# Patient Record
Sex: Male | Born: 1955 | Race: White | Hispanic: No | Marital: Married | Smoking: Current every day smoker
Health system: Southern US, Community
[De-identification: ages and names within clinical notes are randomized; demographics above are authoritative.]

## PROBLEM LIST (undated history)

## (undated) DIAGNOSIS — M1990 Unspecified osteoarthritis, unspecified site: Secondary | ICD-10-CM

## (undated) DIAGNOSIS — D696 Thrombocytopenia, unspecified: Secondary | ICD-10-CM

## (undated) DIAGNOSIS — E039 Hypothyroidism, unspecified: Secondary | ICD-10-CM

## (undated) DIAGNOSIS — E291 Testicular hypofunction: Secondary | ICD-10-CM

## (undated) DIAGNOSIS — Z9989 Dependence on other enabling machines and devices: Secondary | ICD-10-CM

## (undated) DIAGNOSIS — Z8489 Family history of other specified conditions: Secondary | ICD-10-CM

## (undated) DIAGNOSIS — G4733 Obstructive sleep apnea (adult) (pediatric): Secondary | ICD-10-CM

## (undated) DIAGNOSIS — I10 Essential (primary) hypertension: Secondary | ICD-10-CM

## (undated) DIAGNOSIS — I739 Peripheral vascular disease, unspecified: Secondary | ICD-10-CM

## (undated) HISTORY — PX: KNEE ARTHROSCOPY: SUR90

## (undated) HISTORY — PX: FEMORAL-POPLITEAL BYPASS GRAFT: SHX937

## (undated) HISTORY — PX: LUMBAR DISC SURGERY: SHX700

## (undated) HISTORY — PX: NASAL SEPTUM SURGERY: SHX37

## (undated) HISTORY — PX: APPENDECTOMY: SHX54

## (undated) HISTORY — PX: UVULOPALATOPHARYNGOPLASTY: SHX827

---

## 2004-12-20 ENCOUNTER — Observation Stay (HOSPITAL_COMMUNITY): Admission: RE | Admit: 2004-12-20 | Discharge: 2004-12-21 | Payer: Self-pay | Admitting: Orthopedic Surgery

## 2006-03-11 IMAGING — CR DG SPINE 1V PORT
1 series · 1 of 1 positions shown · non-contrast
Comparison: none

HISTORY: Disc herniation L4-L5, surgery

PORTABLE LUMBAR SPINE ONE VIEW:
Portable exam 5599 hours.
Metallic probes extend from posterior approach to the spinous processes of L4
and L5.
Labeling presumes presence of 5 lumbar vertebra. 
Disc space narrowing L4-L5.

[view not recorded]
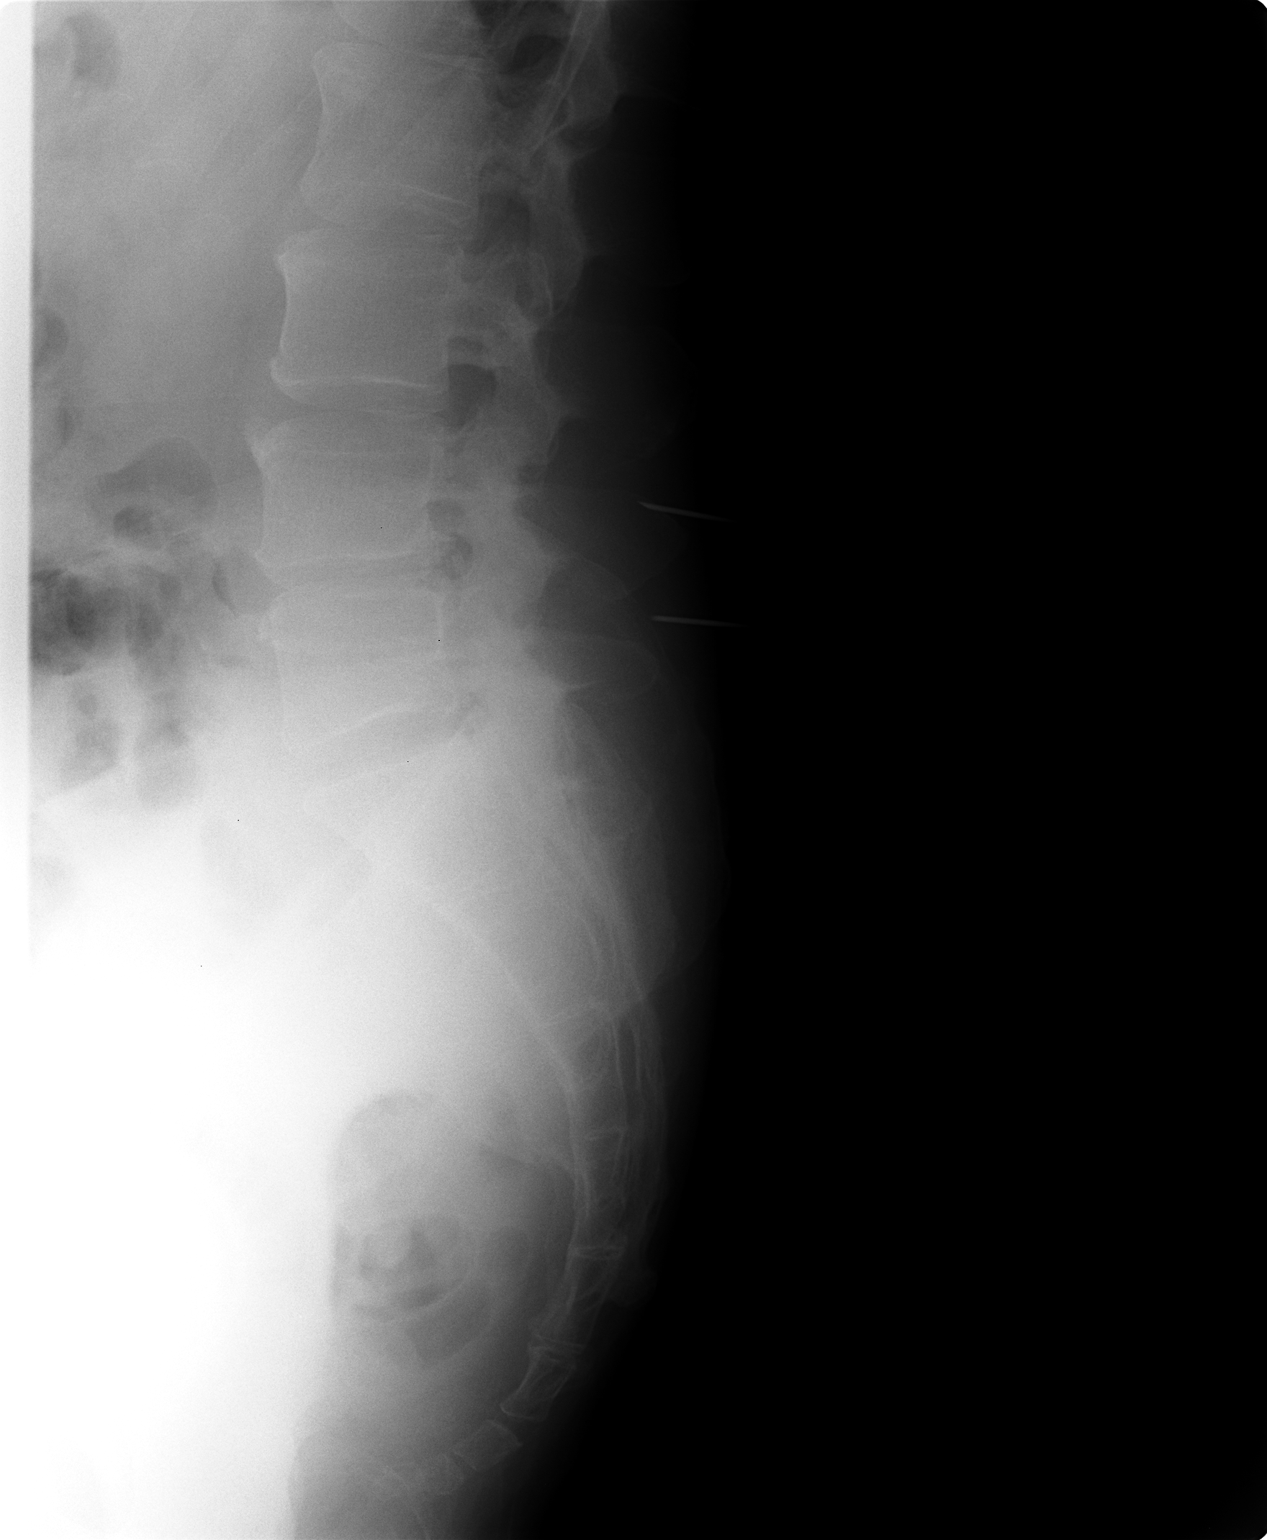

[1 of 1 positions shown; findings below may reference images not displayed]

IMPRESSION: Posterior localization of L4-L5.

PORTABLE LUMBAR SPINE ONE VIEW:

Exam #[DATE] hours.
Posterior metallic surgical instruments located at L4-L5 interspace.
IMPRESSION: Posterior localization of L4-L5.

## 2007-07-22 ENCOUNTER — Ambulatory Visit: Payer: Self-pay | Admitting: Vascular Surgery

## 2007-07-25 ENCOUNTER — Ambulatory Visit (HOSPITAL_COMMUNITY): Admission: RE | Admit: 2007-07-25 | Discharge: 2007-07-25 | Payer: Self-pay | Admitting: Vascular Surgery

## 2007-07-25 ENCOUNTER — Ambulatory Visit: Payer: Self-pay | Admitting: Vascular Surgery

## 2007-08-01 ENCOUNTER — Encounter: Payer: Self-pay | Admitting: Vascular Surgery

## 2007-08-01 ENCOUNTER — Inpatient Hospital Stay (HOSPITAL_COMMUNITY): Admission: RE | Admit: 2007-08-01 | Discharge: 2007-08-03 | Payer: Self-pay | Admitting: Vascular Surgery

## 2007-08-19 ENCOUNTER — Ambulatory Visit: Payer: Self-pay | Admitting: Vascular Surgery

## 2008-10-13 IMAGING — CR DG CHEST 2V
2 series · 2 of 2 positions shown · non-contrast
Comparison: None

CLINICAL DATA: Peripheral vascular disease, preop

CHEST - 2 VIEW:

[view not recorded (1 of 2)]
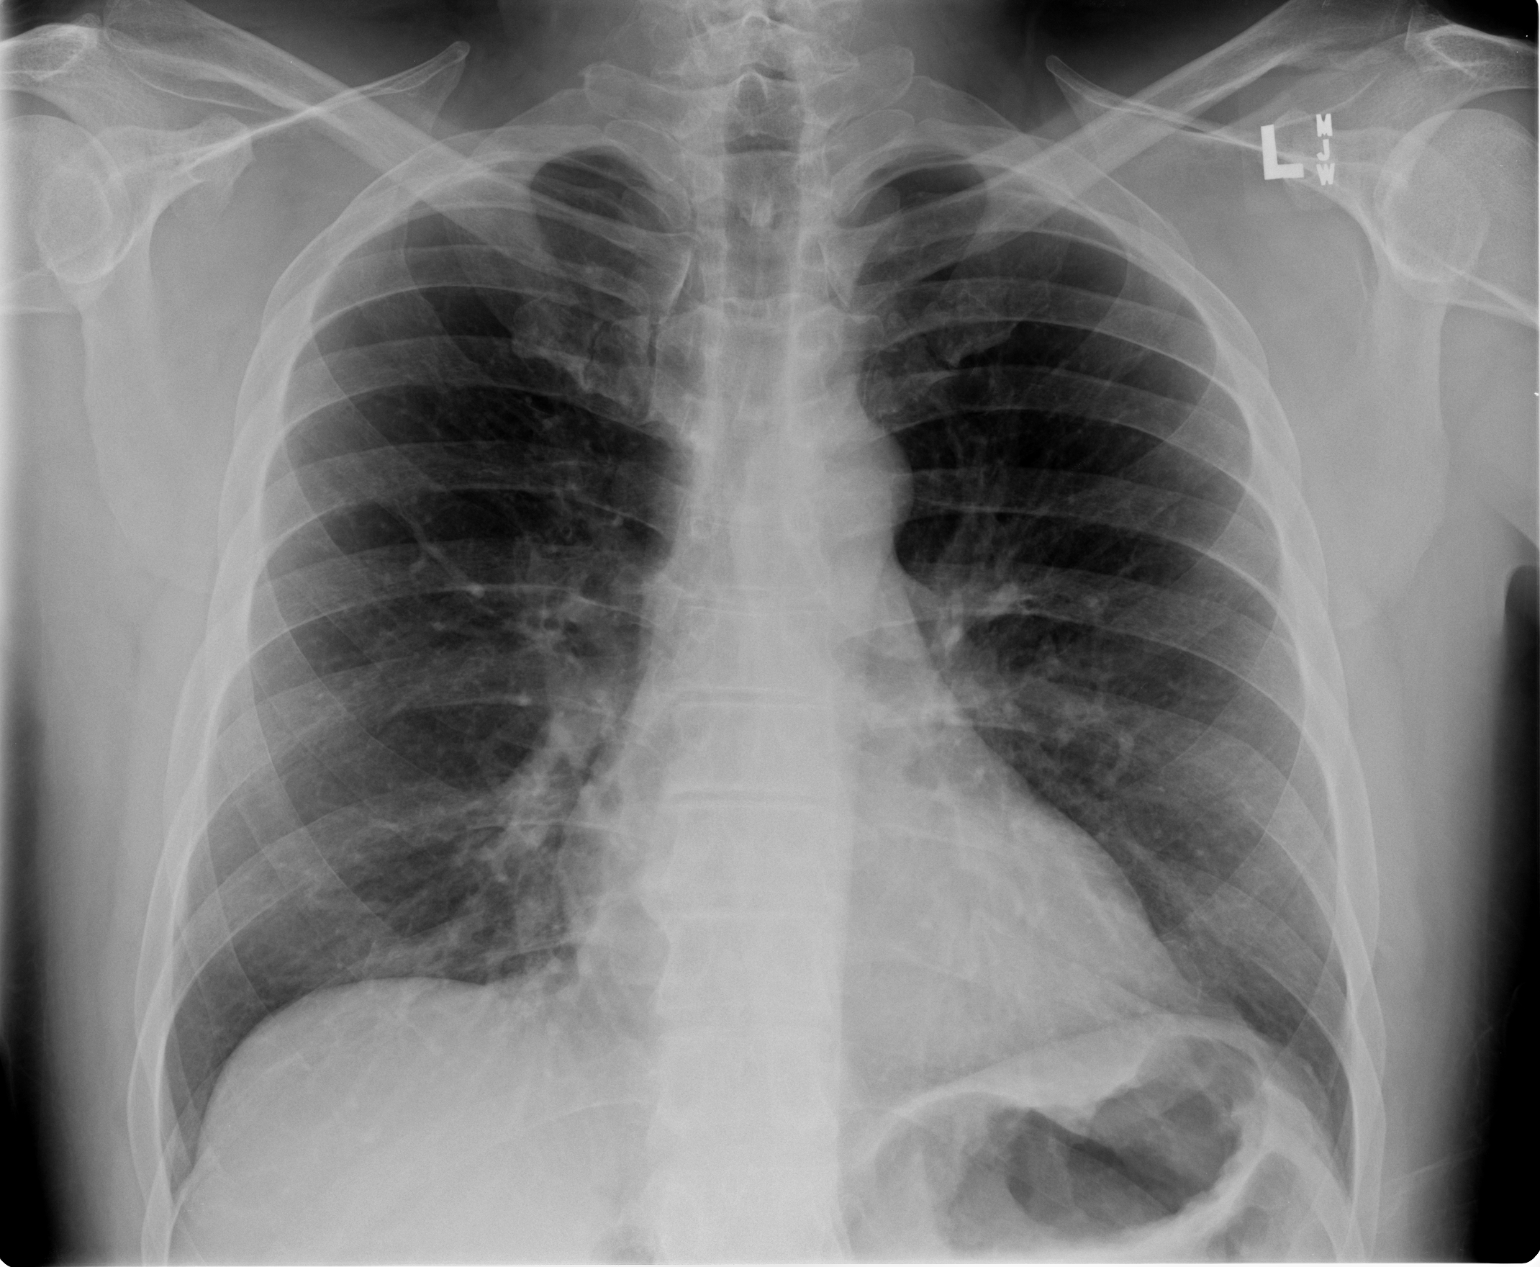

[view not recorded (2 of 2)]
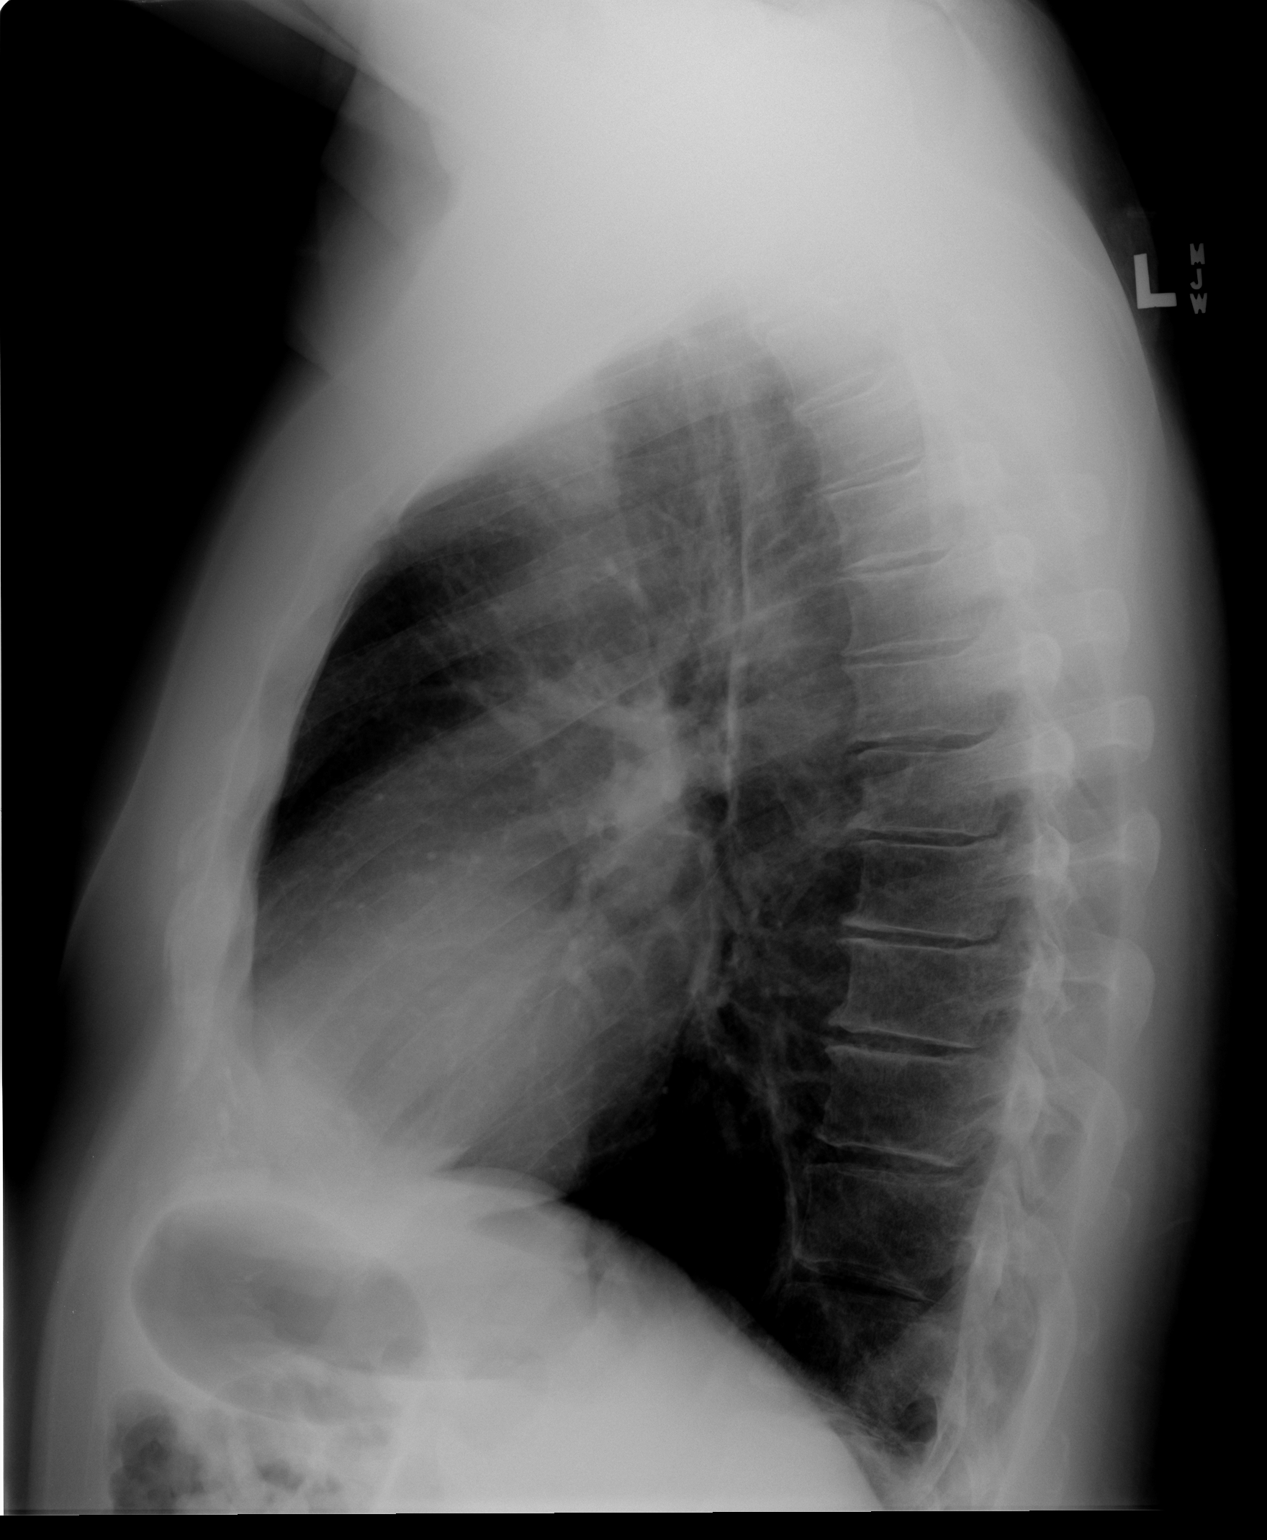

[2 of 2 positions shown; findings below may reference images not displayed]

FINDINGS: Heart and mediastinal contours are within normal limits. There is
mild peribronchial thickening and hyperinflation. No focal opacities or
effusions. Degenerative changes in the thoracic spine.
IMPRESSION: Bronchitic changes, likely chronic.

## 2008-10-20 IMAGING — CR DG ANG/EXT/UNI/OR LEFT
1 series · 1 of 1 positions shown · non-contrast
Comparison: none

CLINICAL DATA: Left femoral artery occlusive disease.
 LEFT LOWER EXTREMITY INTRAOPERATIVE ANGIOGRAM - 08/01/07 AT 5191 HOURS:

[view not recorded]
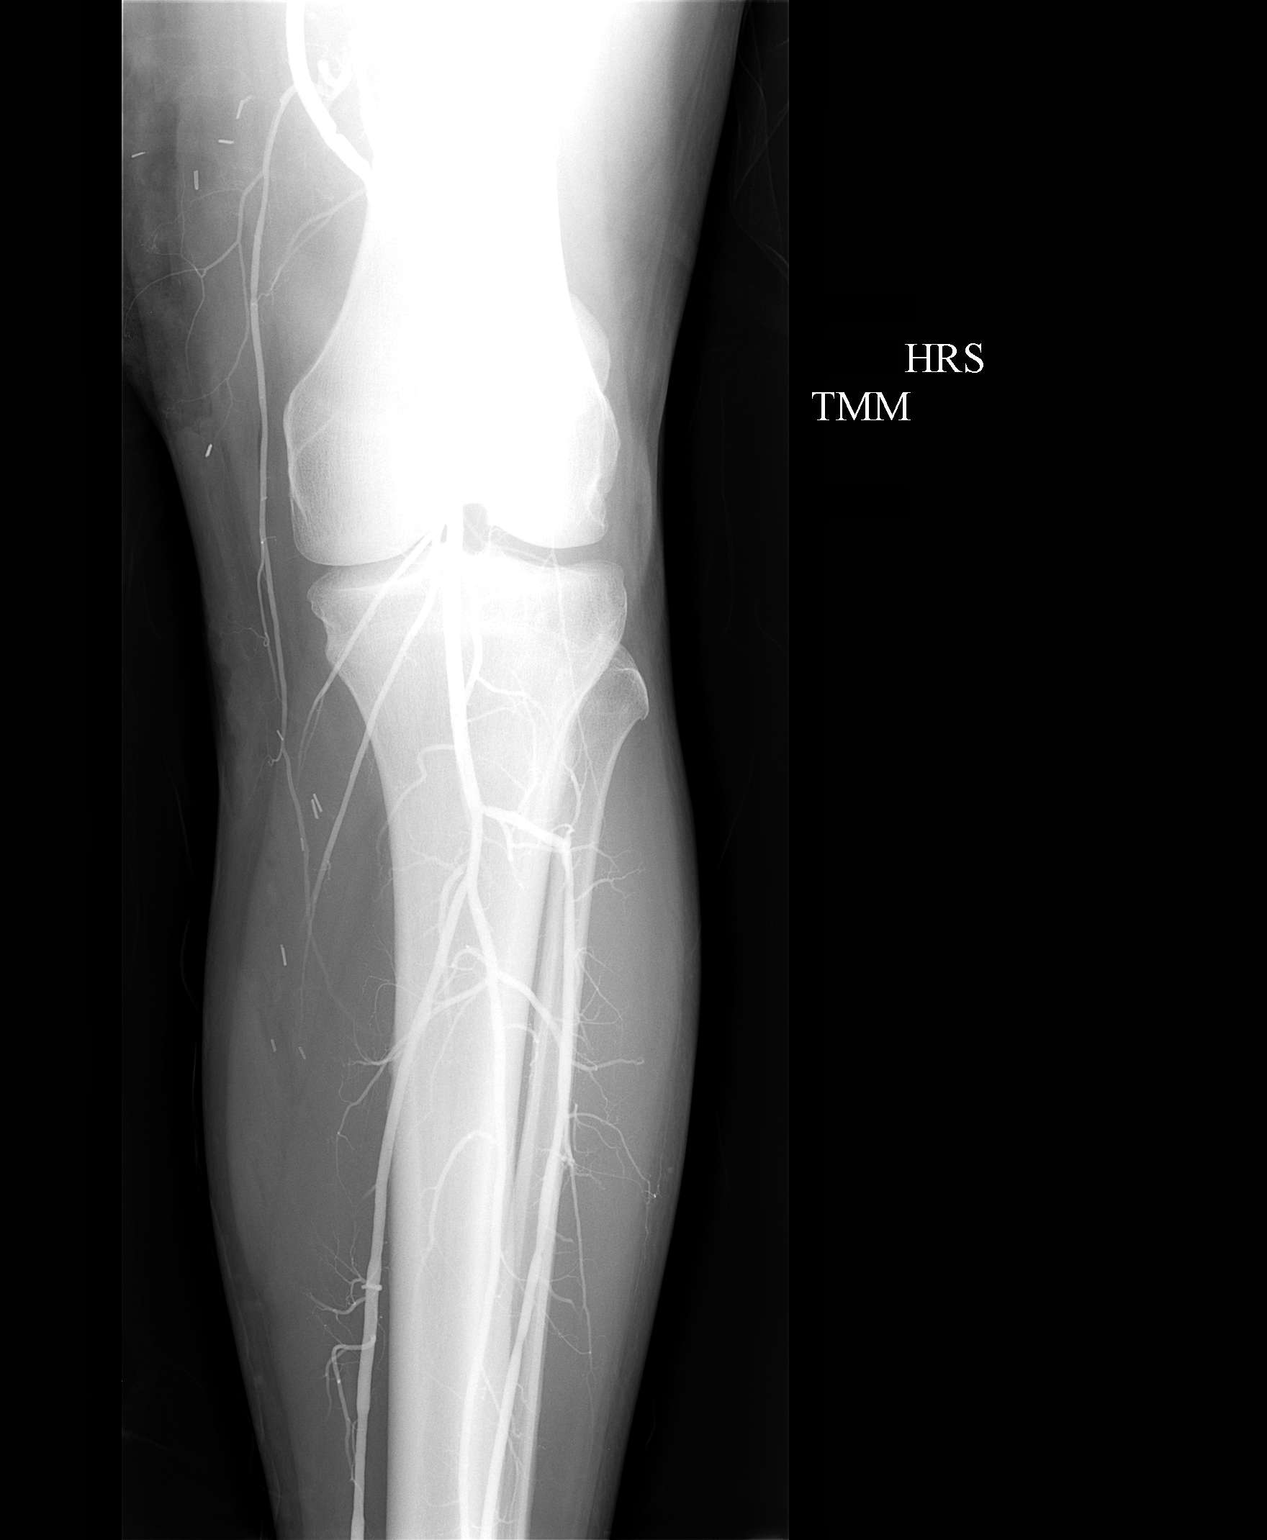

[1 of 1 positions shown; findings below may reference images not displayed]

FINDINGS: A femoral to popliteal artery bypass graft above the knee is patent.  The distal anastomosis is patent.  The popliteal artery and proximal trifurcation vessels are patent.
IMPRESSION: Femoral popliteal bypass graft in the lower thigh and proximal leg is patent.

## 2011-04-10 NOTE — Op Note (Signed)
[name], [name]               ACCOUNT NO.:  [id number]   MEDICAL RECORD NO.:  [id number]          PATIENT TYPE:  INP   LOCATION:  2550                         FACILITY:  [facility]   PHYSICIAN:  Quita Skye. Hart Rochester, M.D.  DATE OF BIRTH:  1956/02/19   DATE OF PROCEDURE:  08/01/2007  DATE OF DISCHARGE:                               OPERATIVE REPORT   PREOPERATIVE DIAGNOSIS:  Left superficial femoral occlusion with  limiting claudication, left leg.   POSTOPERATIVE DIAGNOSIS:  Left superficial femoral occlusion with  limiting claudication, left leg.   OPERATIONS:  Left common femoral to popliteal (above-knee) bypass using  a nonreversed translocated saphenous vein graft (composite), left leg  with intraoperative arteriogram.   SURGEON:  Quita Skye. Hart Rochester, M.D.   FIRST ASSISTANT:  Janetta Hora. Fields, M.D.   SECOND ASSISTANT:  [name], P.A.   ANESTHESIA:  General endotracheal.   PROCEDURE:  The patient was taken to the operating room and placed in  the supine position at which time satisfactory general endotracheal  anesthesia was administered.  The left leg was prepped with Betadine  scrub and solution and draped in the routine sterile manner.   The saphenous vein was exposed at the saphenofemoral junction and  dissected free through multiple incisions along the medial aspect the  left leg.  It was a very good vein for about 10-15 cm where it tapered  down to a smaller size.  It then became larger again in the distal thigh  and then tapered down again below the knee.  It was removed to the  midcalf.  It was then gently dilated with heparinized saline and marked  for orientation purposes.  The common superficial and profunda femoris  arteries were dissected free in the groin incision and encircled with  vessel loops.  There was an excellent pulse in the common femoral  artery.  The superficial femoral artery was diffusely diseased but had a  pulse.  The popliteal artery was  exposed in the above-knee position just  distal to the adductor canal where it was a very soft vessel with a mild  posterior plaque.  The vein was then examined, and it was decided to  resect about a 5-cm segment in the midportion.  This was done after  spatulating both ends.  A new end-to-end anastomosis was done with two 7-  0 Prolene sutures from the heel and the toe.  The patient was then  heparinized.  The common femoral artery was occluded proximally and  distally, opened with a 15 blade and extended with Potts scissors.  The  proximal end of the vein was spatulated and anastomosed end-to-side with  6-0 Prolene.  The clamps were then released.  There was good pulse down  to the first set of competent valves.  Using a retrograde valvulotome,  the valves were rendered incompetent, with resultant excellent flow out  of the distal end of the vein graft.  The vein was carefully delivered  through a subfascial anatomic tunnel.  The popliteal artery was occluded  proximally and distally, opened with a 15 blade extended  with Potts  scissors.  It would easily accept a 3.5-mm dilator distally, although it  did have a posterior plaque.  The vein was spatulated and anastomosed  end-to-side with 6-0 Prolene.  The clamps were released, and there was  an excellent pulse in the vein graft.  Intraoperative arteriogram  revealed a widely patent distal anastomosis with three-vessel runoff  below the knee.  Protamine was given to reverse the heparin.  Following  adequate hemostasis, the wound was irrigated with saline and closed in  layers with Vicryl in subcuticular fashion with Steri-Strips.  A sterile  dressing was applied.   The patient was taken to the recovery in satisfactory condition.      Quita Skye Hart Rochester, M.D.  Electronically Signed     JDL/MEDQ  D:  08/01/2007  T:  08/01/2007  Job:  [id number]

## 2011-04-10 NOTE — Assessment & Plan Note (Signed)
OFFICE VISIT   KHY, PITRE  DOB:  [birth year]                                       07/22/2007  ZOXWR#:[id number]   Mr. Michalowski returns today having worsening of his claudication symptoms  in the left calf and a lot of numbness involving the left foot when he  ambulates. This occurs after walking for about 3 to 4 minutes. He is  able to continue to walk but he has significant discomfort in the left  calf and numbness in the foot. His right leg has very minimal symptoms.  He has no rest pain or non healing ulcers by history.   PHYSICAL EXAMINATION:  Blood pressure is 141/90, heart rate 68,  respirations are 16. His femoral pulses are 2 to 3 + bilaterally. Right  leg has a 2 + popliteal and 2+ dorsalis pedis pulse. Left leg has  diminished pulses distally. Both feet are warm and well perfused.  Previous ABI in the left leg was 0.85 and on the right 1.1 which was  checked in January of 2007. I did not repeat ABIs today but he does have  evidence of femoral popliteal occlusive disease. He wants to proceed  with an angiogram. We will do that on this Friday August 29 and we will  then determine what options are available.   Quita Skye Hart Rochester, M.D.  Electronically Signed   JDL/MEDQ  D:  07/22/2007  T:  07/23/2007  Job:  [id number]   cc:   Maryruth Eve. Darrelyn Hillock, M.D.

## 2011-04-10 NOTE — Assessment & Plan Note (Signed)
OFFICE VISIT   GERY, SABEDRA  DOB:  02-Nov-1956                                       08/19/2007  WGNFA#:[id number]   The patient is status post a left femoral popliteal bypass graft done  September 5 using a composite saphenous vein graft from the left leg.  His vein was not an excellent vein and did have a portion in the mid  part that needed resection and primary reanastomosis.  His ABI today is  only 0.70 compared to 0.87 previously.  He states that his left leg  surgery are definitely improved from preoperatively, and that he is able  to walk longer distances before his calf becomes uncomfortable.  His  incisions have healed nicely and he has no distal edema.  He has an  excellent femoral pulse, but I am unable to palpate a popliteal pulse.  I had a scan of the graft performed today, and it appears that the  bypass is occluded.  This was a technically difficult scan and it took a  while to determine this.  I have told him that the graft is occluded,  and that if his symptoms worsen, we could consider an above the knee  popliteal with GORE-TEX graft, since his vein was fairly marginal, and  we will consider this in 3 months if his symptoms worsen.  Return to see  me in 3 months for further followup.   Quita Skye Hart Rochester, M.D.  Electronically Signed   JDL/MEDQ  D:  08/19/2007  T:  08/20/2007  Job:  [id number]

## 2011-04-10 NOTE — H&P (Signed)
HISTORY AND PHYSICAL EXAMINATION   July 29, 2007   Re:  JAMA, KRICHBAUM               DOB:  1956/06/03   The date of admission will be August 01, 2007.   CHIEF COMPLAINT:  Severe pain in left calf with ambulation.   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  He is currently  being limited to walking about 3 to 4 minutes, at which time he develops  severe symptoms, which will require him eventually to rest for relief.  He has no rest pain or history of non-healing ulcers, infection, or  ulceration.  He was found on angiography by Dr. Hart Rochester to have a left  superficial femoral occlusion with an above knee popliteal artery on the  left and is scheduled now for left femoral popliteal bypass grafting  using saphenous vein on September 5th.   PAST MEDICAL HISTORY:  Negative for diabetes, hypertension,  hyperlipidemia, coronary artery disease, arrhythmia, stroke, or deep  vein thrombosis.   PREVIOUS SURGERY:  1. Meniscectomy left knee.  2. Arthroscopy left knee.  3. Appendectomy.  4. Lumbar laminectomy.   ALLERGIES:  None known.   FAMILY HISTORY:  Positive for coronary artery disease in his father.  Negative for diabetes and stroke.   SOCIAL HISTORY:  Works in Patent examiner.  He does not admit to any  tobacco use at this time.  Drinks occasional alcohol.   REVIEW OF SYSTEMS:  HEART:  Denies any chest pain, dyspnea on exertion,  paroxysmal nocturnal dyspnea, orthopnea.  GENERAL:  Denies weight loss, anorexia  LUNGS:  Denies pulmonary symptoms such as chronic bronchitis, wheezing,  asthma.  GASTROINTESTINAL:  Negative for gastrointestinal symptoms.  MUSCULOSKELETAL:  Has occasional joint pain related to his orthopedic  problems.   ALLERGIES:  None known.   MEDICATIONS:  None.   PHYSICAL EXAMINATION:   Vital signs:  Blood pressure is 141/90, heart  rate 68, respirations 16.  General:  He is a healthy-appearing middle-  aged male in no apparent distress.  He is alert and oriented x3.  Neck:  Supple with 3+ carotid pulses palpable.  No bruits are audible.  Neurologic exam:  Normal.  No palpable adenopathy in the neck.  Chest:  Clear to auscultation.  Cardiovascular exam:  Regular rhythm with no  murmurs.  Upper extremities:  Pulses are 3+ bilaterally.  Abdomen:  Soft, nontender, with no palpable masses.  Lower extremities:  He has 3+  femoral, 2+ popliteal,, and 2+ dorsalis pedis pulses on the right.  Left  leg has a 3+ femoral with absent distal pulses.  Both feet are well-  perfused.   IMPRESSION:  1. Left leg claudication secondary to left superficial femoral      occlusion.  2. History of left knee surgery.  3. History of appendectomy and lumbar laminectomy.   PLAN:  To admit the patient  on Friday, September 5, for an elective  left femoral popliteal bypass graft with saphenous vein if it is usable.  Risks and benefits have been fully discussed with Mr. Weinreb and he  would like to proceed.   Quita Skye Hart Rochester, M.D.  Electronically Signed   JDL/MEDQ  D:  07/29/2007  T:  07/30/2007  Job:  [id number]   cc:   Windy Fast A. Darrelyn Hillock, M.D.

## 2011-04-10 NOTE — Op Note (Signed)
[name], [name]               ACCOUNT NO.:  [id number]   MEDICAL RECORD NO.:  [id number]          PATIENT TYPE:  AMB   LOCATION:  SDS                          FACILITY:  [facility]   PHYSICIAN:  Quita Skye. Hart Rochester, M.D.  DATE OF BIRTH:  Mar 12, 1956   DATE OF PROCEDURE:  07/25/2007  DATE OF DISCHARGE:                               OPERATIVE REPORT   PREOPERATIVE DIAGNOSIS:  Left leg claudication secondary to left  superficial femoral occlusive disease.   POSTOPERATIVE DIAGNOSIS:  Left leg claudication secondary to left  superficial femoral occlusive disease.   PROCEDURE:  1. Abdominal aortogram with bilateral lower extremity runoff via right      common femoral approach.  2. Selective catheterization of left common iliac artery with left      lower extremity angiogram.   SURGEON:  Quita Skye. Hart Rochester, M.D.   ANESTHESIA:  Local Xylocaine and Versed 2 mg intravenously.   CONTRAST:  145 mL.   COMPLICATIONS:  None.   DESCRIPTION OF PROCEDURE:  The patient was taken to the [facility]  Peripheral Endovascular Lab, placed in the supine position, at which  time both groins were prepped with Betadine solution and draped in  routine sterile manner.  After infiltration of 1% Xylocaine, the right  common femoral artery was entered percutaneously, a guidewire passed  into the suprarenal aorta under fluoroscopic guidance.  A 5 French  sheath and dilator were passed over the guidewire, the dilator removed,  and standard pigtail catheter positioned in the suprarenal aorta.  Flush  abdominal aortogram was performed injecting 20 mL of contrast at 20 mL  per second.  This revealed the aorta to be widely patent with no  evidence of any significant atherosclerosis.  Both renal arteries were  widely patent, being single on both sides.  The inferior mesenteric  artery was patent.  The common, internal and external iliac arteries  were patent.  The catheter was withdrawn into the terminal aorta and  iliac  angiography was performed revealing widely patent iliac systems  down to the common femorals bilaterally.   Because of the size of his legs, both legs were examined separately.  The left common iliac artery was selectively catheterized using an IMA  catheter and left lower extremity angiogram performed using the bolus  and chase technique.  This was done injecting 35 mL of contrast and it  revealed that the left common and profunda femoris arteries were widely  patent.  The left superficial femoral artery was diseased throughout,  being small in caliber proximally, totally occluded in the mid thigh,  reconstituting about 8 cm above the knee joint and being widely patent.  There was three vessel runoff on the left.  Additional views were  obtained using the peep hole technique, both of the above knee popliteal  artery and the runoff.  Following this, the IMA catheter was removed  over the guidewire. Right lower extremity angiogram was performed  through the sheath in a similar fashion.  The right common, superficial,  and profunda femoris arteries were all widely patent with no evidence of  any  significant stenosis throughout and there was three vessel runoff on  the right.  Having tolerated the procedure well, the sheath was removed,  adequate compression applied.  No complications ensued.   FINDINGS:  1. Widely patent aortoiliac system.  2. Widely patent right superficial femoral, popliteal, and tibial      vessels.  3. Total occlusion left superficial femoral artery with reconstitution      above knee popliteal artery and three vessel runoff.      Quita Skye Hart Rochester, M.D.  Electronically Signed     JDL/MEDQ  D:  07/25/2007  T:  07/26/2007  Job:  [id number]

## 2011-04-10 NOTE — Procedures (Signed)
BYPASS GRAFT EVALUATION   INDICATION:  Follow-up evaluation of lower extremity bypass graft.   HISTORY:  Diabetes:  No.  Cardiac:  No.  Hypertension:  No.  Smoking:  Yes.  Previous Surgery:  Left femoral-popliteal bypass graft on 08/04/2007,  with composite saphenous vein by Dr. Hart Rochester.   SINGLE LEVEL ARTERIAL EXAM                               RIGHT              LEFT  Brachial:                    126.               118.  Anterior tibial:             130.               72.  Posterior tibial:            136.               88.  Peroneal:  Ankle/brachial index:        > 1.0.             0.70.   PREVIOUS ABI:  Date: 08/01/2007.  RIGHT:  > 1.0.  LEFT:  0.87 (preop).   LOWER EXTREMITY BYPASS GRAFT DUPLEX EXAM:   DUPLEX:  The left superficial femoral and profunda artery were clearly  identified.  There appeared to be a significant number of collateral  vessels originating off of the superficial femoral artery.  In the  distal thigh region, the vein graft was identified and appeared to be  occluded.   IMPRESSION:  Right ABI stable from previous study.   The left ABI is lower than the preoperative level.   Occluded left femoral-popliteal bypass graft.   ___________________________________________  Quita Skye. Hart Rochester, M.D.   MC/MEDQ  D:  08/19/2007  T:  08/20/2007  Job:  [id number]
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
## 2011-04-13 NOTE — Op Note (Signed)
[name], [name]               ACCOUNT NO.:  [id number]   MEDICAL RECORD NO.:  [id number]          PATIENT TYPE:  AMB   LOCATION:  DAY                          FACILITY:  [facility]   PHYSICIAN:  Georges Lynch. Gioffre, M.D.DATE OF BIRTH:  1956-08-02   DATE OF PROCEDURE:  12/20/2004  DATE OF DISCHARGE:                                 OPERATIVE REPORT   PREOPERATIVE DIAGNOSES:  1.  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  2.  Central and to the left herniated disk, L4-5 on the left with a partial      foot drop on the left.   POSTOPERATIVE DIAGNOSIS:  1.  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  2.  Central and to the left herniated disk, L4-5 on the left with a partial      foot drop on the left.   OPERATION PERFORMED:  1.  Decompression of the lateral recess at L4-5 on the left.  2.  Microdiskectomy at L4-5 on the left with hemilaminectomy.   SURGEON:  Georges Lynch. [name], M.D.   ASSISTANT:  Ronnell Guadalajara, M.D.   ANESTHESIA:  General.   DESCRIPTION OF PROCEDURE:  Under general anesthesia, routine orthopedic  prepping and draping of the lower back was carried out.  The patient had 1 g  of IV Ancef preop.  At this time two needles were placed on the back for  localization purposes.  X-ray was taken.  We identified the L4-5 space.  An  incision then was made over the L4-5 interspace.  Bleeders were identified  and cauterized.  The muscle then was stripped from the lamina and spinous  process down to the L4-5 interspace.  Another x-ray was taken to verify our  position.  At this time I then inserted McCullough self-retaining  retractors.  I utilized the bur to bur down the lamina of 4 and 5 and the  lateral recess was decompressed as well.  Following this, we then utilized  our Kerrison rongeurs and carried out a hemilaminectomy.  We then removed  the ligamentum flavum with great care taken to protect the dura.  We  thoroughly irrigated  out the area.  We then went down and identified the  disk herniation.  We first cauterized the lateral recess veins. He had a  multitude of lateral recess veins that we cauterized with a bipolar.  We  first identified the nerve root and gently retracted it out of the way.  We  went down and did a foraminotomy.  We then identified the disk space and  made a cruciate incision, went down in the disk space and carried out a  diskectomy at 4-5 on the left.  We went all the way across the midline,  utilizing the Epstein curets as well as the nerve hook and teased out  several fragments that were subligamentous.  He also had a large hump of  bone in that area that was from his underlying bone spurs.  We freed up the  nerve root quite nicely, completed the  diskectomy, thoroughly irrigated out  the area and we explored the area once again to make sure there were no  other disk fragments.  We were able to easily get a hockey stick now down  around into the foramen without a problem.  We then took another x-ray with  an instrument in the space to verify  the position.  Following this, we then went on and irrigated the area again  and loosely applied some Thrombin soaked Gelfoam.  We closed the wound in  layers in the usual fashion.  The deep part of the wound proximally was left  open for drainage purposes.  The skin was closed with metal staples.  Sterile Neosporin dressing was applied.      RAG/MEDQ  D:  12/20/2004  T:  12/20/2004  Job:  [id number]

## 2011-09-07 LAB — CBC
HCT: 41.7
MCHC: 34.4
MCV: 91
MCV: 91.4
MCV: 90.8
Platelets: 164
Platelets: 216
RBC: 4.77
RDW: 12.8
RDW: 13.1
WBC: 23.3 — ABNORMAL HIGH

## 2011-09-07 LAB — URINALYSIS, ROUTINE W REFLEX MICROSCOPIC
Ketones, ur: NEGATIVE
Nitrite: NEGATIVE
Protein, ur: NEGATIVE
pH: 6.5

## 2011-09-07 LAB — COMPREHENSIVE METABOLIC PANEL
AST: 24
CO2: 28
Calcium: 9.6
Creatinine, Ser: 0.88
GFR calc Af Amer: >60
GFR calc non Af Amer: >60
Total Protein: 6.6

## 2011-09-07 LAB — BASIC METABOLIC PANEL
BUN: 9
BUN: 11
CO2: 31
Chloride: 104
Chloride: 104
Creatinine, Ser: 0.87
GFR calc non Af Amer: >60
Glucose, Bld: 111 — ABNORMAL HIGH
Potassium: 4.1

## 2011-09-07 LAB — PROTIME-INR
Prothrombin Time: 12.3
Prothrombin Time: 12.4

## 2011-09-07 LAB — APTT: aPTT: 27

## 2018-05-21 ENCOUNTER — Ambulatory Visit: Payer: Self-pay | Admitting: Physician Assistant

## 2018-05-23 NOTE — Pre-Procedure Instructions (Signed)
[name]  05/23/2018      [name] [place] - [address] [name] [address] [name] [id number] Phone: [phone] Fax: [phone]    Your procedure is scheduled on July 12th.  Report to [name] at 8:00 A.M.  Call this number if you have problems the morning of surgery:  [phone]   Remember:  Do not eat or drink after midnight.   Continue all medications as directed by your physician except follow these medication instructions before surgery below     Take these medicines the morning of surgery with A SIP OF WATER   amLODipine (NORVASC) 5 MG tablet gabapentin (NEURONTIN) 300 MG capsule levothyroxine (SYNTHROID, LEVOTHROID) 50 MCG tablet propranolol ER (INDERAL LA) 60 MG 24 hr capsule levocetirizine (XYZAL) 5 MG tablet venlafaxine XR (EFFEXOR-XR) 150 MG 24 hr capsule  7 days prior to surgery STOP taking any Aspirin(unless otherwise instructed by your surgeon), diclofenac (VOLTAREN), Aleve, Naproxen, Ibuprofen, Motrin, Advil, Goody's, BC's, all herbal medications, fish oil, and all vitamins   Do not wear jewelry  Do not wear lotions, powders, colognes, or deodorant.  Men may shave face and neck.  Do not bring valuables to the hospital.  [name] is not responsible for any belongings or valuables.  Eyeglasses, contacts, hearing aids, dentures or bridgework may not be worn into surgery.  Leave your suitcase in the car.  After surgery it may be brought to your room.  For patients admitted to the hospital, discharge time will be determined by your treatment team.  Patients discharged the day of surgery will not be allowed to drive home.    [name]- Preparing For Surgery  Before surgery, you can play an important role. Because skin is not sterile, your skin needs to be as free of germs as possible. You can reduce the number of germs on your skin by washing with  CHG (chlorahexidine gluconate) Soap before surgery.  CHG is an antiseptic cleaner which kills germs and bonds with the skin to continue killing germs even after washing.    Oral Hygiene is also important to reduce your risk of infection.  Remember - BRUSH YOUR TEETH THE MORNING OF SURGERY WITH YOUR REGULAR TOOTHPASTE  Please do not use if you have an allergy to CHG or antibacterial soaps. If your skin becomes reddened/irritated stop using the CHG.  Do not shave (including legs and underarms) for at least 48 hours prior to first CHG shower. It is OK to shave your face.  Please follow these instructions carefully.   1. Shower the NIGHT BEFORE SURGERY and the MORNING OF SURGERY with CHG.   2. If you chose to wash your hair, wash your hair first as usual with your normal shampoo.  3. After you shampoo, rinse your hair and body thoroughly to remove the shampoo.  4. Use CHG as you would any other liquid soap. You can apply CHG directly to the skin and wash gently with a scrungie or a clean washcloth.   5. Apply the CHG Soap to your body ONLY FROM THE NECK DOWN.  Do not use on open wounds or open sores. Avoid contact with your eyes, ears, mouth and genitals (private parts). Wash Face and genitals (private parts)  with your normal soap.  6. Wash thoroughly, paying special attention to the area where your surgery will be performed.  7. Thoroughly rinse your body with  warm water from the neck down.  8. DO NOT shower/wash with your normal soap after using and rinsing off the CHG Soap.  9. Pat yourself dry with a CLEAN TOWEL.  10. Wear CLEAN PAJAMAS to bed the night before surgery, wear comfortable clothes the morning of surgery  11. Place CLEAN SHEETS on your bed the night of your first shower and DO NOT SLEEP WITH PETS.    Day of Surgery:  Do not apply any deodorants/lotions.  Please wear clean clothes to the hospital/surgery [name].   Remember to brush your teeth WITH YOUR REGULAR  TOOTHPASTE.   Please read over the following fact sheets that you were given.

## 2018-05-26 ENCOUNTER — Encounter (HOSPITAL_COMMUNITY)
Admission: RE | Admit: 2018-05-26 | Discharge: 2018-05-26 | Disposition: A | Discharge status: Home / Self Care | Source: Ambulatory Visit | Attending: Orthopedic Surgery | Admitting: Orthopedic Surgery

## 2018-05-26 ENCOUNTER — Other Ambulatory Visit: Payer: Self-pay

## 2018-05-26 ENCOUNTER — Encounter (HOSPITAL_COMMUNITY): Payer: Self-pay

## 2018-05-26 DIAGNOSIS — I739 Peripheral vascular disease, unspecified: Secondary | ICD-10-CM | POA: Diagnosis not present

## 2018-05-26 DIAGNOSIS — Z01812 Encounter for preprocedural laboratory examination: Secondary | ICD-10-CM | POA: Insufficient documentation

## 2018-05-26 DIAGNOSIS — M1712 Unilateral primary osteoarthritis, left knee: Secondary | ICD-10-CM | POA: Diagnosis not present

## 2018-05-26 DIAGNOSIS — I10 Essential (primary) hypertension: Secondary | ICD-10-CM | POA: Insufficient documentation

## 2018-05-26 DIAGNOSIS — G4733 Obstructive sleep apnea (adult) (pediatric): Secondary | ICD-10-CM | POA: Insufficient documentation

## 2018-05-26 DIAGNOSIS — Z79899 Other long term (current) drug therapy: Secondary | ICD-10-CM | POA: Insufficient documentation

## 2018-05-26 DIAGNOSIS — E039 Hypothyroidism, unspecified: Secondary | ICD-10-CM | POA: Insufficient documentation

## 2018-05-26 HISTORY — DX: Peripheral vascular disease, unspecified: I73.9

## 2018-05-26 HISTORY — DX: Family history of other specified conditions: Z84.89

## 2018-05-26 HISTORY — DX: Essential (primary) hypertension: I10

## 2018-05-26 HISTORY — DX: Unspecified osteoarthritis, unspecified site: M19.90

## 2018-05-26 HISTORY — DX: Hypothyroidism, unspecified: E03.9

## 2018-05-26 LAB — CBC WITH DIFFERENTIAL/PLATELET
ABS IMMATURE GRANULOCYTES: 0 10*3/uL (ref 0.0–0.1)
BASOS ABS: 0.1 10*3/uL (ref 0.0–0.1)
Basophils Relative: 1 %
EOS PCT: 2 %
Eosinophils Absolute: 0.3 10*3/uL (ref 0.0–0.7)
HEMATOCRIT: 53.5 % — AB (ref 39.0–52.0)
HEMOGLOBIN: 18.2 g/dL — AB (ref 13.0–17.0)
Immature Granulocytes: 0 %
LYMPHS ABS: 4.1 10*3/uL — AB (ref 0.7–4.0)
LYMPHS PCT: 34 %
MCH: 31.9 pg (ref 26.0–34.0)
MCHC: 34 g/dL (ref 30.0–36.0)
MCV: 93.9 fL (ref 78.0–100.0)
MONO ABS: 0.8 10*3/uL (ref 0.1–1.0)
MONOS PCT: 7 %
Neutro Abs: 6.8 10*3/uL (ref 1.7–7.7)
Neutrophils Relative %: 56 %
Platelets: 195 10*3/uL (ref 150–400)
RBC: 5.7 MIL/uL (ref 4.22–5.81)
RDW: 12 % (ref 11.5–15.5)
WBC: 12.1 10*3/uL — ABNORMAL HIGH (ref 4.0–10.5)

## 2018-05-26 LAB — URINALYSIS, ROUTINE W REFLEX MICROSCOPIC
Bilirubin Urine: NEGATIVE
GLUCOSE, UA: NEGATIVE mg/dL
Hgb urine dipstick: NEGATIVE
Ketones, ur: NEGATIVE mg/dL
LEUKOCYTES UA: NEGATIVE
Nitrite: NEGATIVE
Protein, ur: NEGATIVE mg/dL
Specific Gravity, Urine: 1.012 (ref 1.005–1.030)
pH: 6 (ref 5.0–8.0)

## 2018-05-26 LAB — TYPE AND SCREEN
ABO/RH(D): A POS
Antibody Screen: NEGATIVE

## 2018-05-26 LAB — PROTIME-INR
INR: 0.98
Prothrombin Time: 12.9 seconds (ref 11.4–15.2)

## 2018-05-26 LAB — COMPREHENSIVE METABOLIC PANEL
ALT: 51 U/L — ABNORMAL HIGH (ref 0–44)
AST: 48 U/L — ABNORMAL HIGH (ref 15–41)
Albumin: 4.3 g/dL (ref 3.5–5.0)
Alkaline Phosphatase: 53 U/L (ref 38–126)
Anion gap: 10 (ref 5–15)
BUN: 16 mg/dL (ref 8–23)
CO2: 27 mmol/L (ref 22–32)
Calcium: 9.6 mg/dL (ref 8.9–10.3)
Chloride: 102 mmol/L (ref 98–111)
Creatinine, Ser: 1.1 mg/dL (ref 0.61–1.24)
GFR calc Af Amer: >60 mL/min (ref >60)
GFR calc non Af Amer: >60 mL/min (ref >60)
Glucose, Bld: 105 mg/dL — ABNORMAL HIGH (ref 70–99)
Potassium: 4.3 mmol/L (ref 3.5–5.1)
Sodium: 139 mmol/L (ref 135–145)
Total Bilirubin: 0.7 mg/dL (ref 0.3–1.2)
Total Protein: 7 g/dL (ref 6.5–8.1)

## 2018-05-26 LAB — APTT: aPTT: 27 seconds (ref 24–36)

## 2018-05-26 LAB — SURGICAL PCR SCREEN
MRSA, PCR: NEGATIVE
Staphylococcus aureus: NEGATIVE

## 2018-05-26 NOTE — Progress Notes (Signed)
PCP is Dr. Kem Boroughserry [facility]  [phone]   LOV 04/2018  Am trying to obtain last office note.  Sees cardiologist Dr. Burman FosterHerman Barret Cheek [phone] Did have stress test in 01/2016.  I am attempting to get those results . Currently denies murmur, cp, sob.

## 2018-05-27 ENCOUNTER — Ambulatory Visit: Payer: Self-pay | Admitting: Physician Assistant

## 2018-05-27 LAB — URINE CULTURE: CULTURE: NO GROWTH

## 2018-05-27 NOTE — H&P (View-Only) (Signed)
TOTAL KNEE ADMISSION H&P  Patient is being admitted for left total knee arthroplasty.  Subjective:  Chief Complaint:left knee pain.  HPI: [name], 62 y.o. male, has a history of pain and functional disability in the left knee due to arthritis and has failed non-surgical conservative treatments for greater than 12 weeks to includeNSAID's and/or analgesics, corticosteriod injections, viscosupplementation injections and activity modification.  Onset of symptoms was gradual, starting >10 years ago with rapidlly worsening course since that time. The patient noted no past surgery on the left knee(s).  Patient currently rates pain in the left knee(s) at 10 out of 10 with activity. Patient has night pain, worsening of pain with activity and weight bearing, pain that interferes with activities of daily living, pain with passive range of motion, crepitus and joint swelling.  Patient has evidence of periarticular osteophytes and joint space narrowing by imaging studies.  There is no active infection.  There are no active problems to display for this patient.  Past Medical History:  Diagnosis Date  . Arthritis   . Family history of adverse reaction to anesthesia    "mother allergic to one of them, don't know what it was"  . Hypertension   . Hypothyroidism   . PAD (peripheral artery disease) (HCC)    had surgery by Dr. Phillips GroutJ  Lawson  . Sleep apnea    positive......Marland Kitchen.wears mask    Past Surgical History:  Procedure Laterality Date  . APPENDECTOMY    . arthroscopies     bilateral knees   2 & 3 times  . TONSILLECTOMY    . VASCULAR SURGERY     x 2       Current Outpatient Medications  Medication Sig Dispense Refill Last Dose  . amLODipine (NORVASC) 5 MG tablet Take 5 mg by mouth 2 (two) times daily.  2   . Cholecalciferol (VITAMIN D3) 5000 units TABS Take 5,000 Units by mouth daily.     . diazepam (VALIUM) 5 MG tablet Take 5 mg by mouth 3 (three) times daily as needed for anxiety.  0   .  diclofenac (VOLTAREN) 75 MG EC tablet Take 75 mg by mouth 2 (two) times daily.  2   . gabapentin (NEURONTIN) 300 MG capsule Take 300 mg by mouth 3 (three) times daily as needed for pain.  1   . levocetirizine (XYZAL) 5 MG tablet Take 5 mg by mouth daily.  1   . levothyroxine (SYNTHROID, LEVOTHROID) 50 MCG tablet Take 50 mcg by mouth every morning.  3   . Multiple Vitamin (MULTIVITAMIN) tablet Take 2 tablets by mouth daily.     . propranolol ER (INDERAL LA) 60 MG 24 hr capsule Take 60 mg by mouth 2 (two) times daily.  3   . rosuvastatin (CRESTOR) 10 MG tablet Take 10 mg by mouth daily.  1   . venlafaxine XR (EFFEXOR-XR) 150 MG 24 hr capsule Take 150 mg by mouth daily.  3   . vitamin E 1000 UNIT capsule Take 1,000 Units by mouth daily.     Marland Kitchen. zolpidem (AMBIEN) 10 MG tablet Take 10 mg by mouth daily.  0    No current facility-administered medications for this visit.    No Known Allergies  Social History   Tobacco Use  . Smoking status: Current Some Day Smoker    Packs/day: 0.50    Years: 35.00    Pack years: 17.50    Types: Cigarettes  . Smokeless tobacco: Never Used  Substance  Use Topics  . Alcohol use: Yes    Alcohol/week: 4.2 oz    Types: 7 Glasses of wine per week    No family history on file.   Review of Systems  Musculoskeletal: Positive for joint pain.  Endo/Heme/Allergies: Bruises/bleeds easily.  All other systems reviewed and are negative.   Objective:  Physical Exam  Constitutional: He is oriented to person, place, and time. He appears well-developed and well-nourished. No distress.  HENT:  Head: Normocephalic and atraumatic.  Nose: Nose normal.  Eyes: Pupils are equal, round, and reactive to light. Conjunctivae and EOM are normal.  Neck: Normal range of motion. Neck supple.  Cardiovascular: Normal rate, regular rhythm, normal heart sounds and intact distal pulses.  Respiratory: Effort normal and breath sounds normal. No respiratory distress. He has no wheezes.   GI: Soft. Bowel sounds are normal. He exhibits no distension. There is no tenderness.  Musculoskeletal:       Left knee: He exhibits decreased range of motion. He exhibits no effusion and no erythema. Tenderness found.  Lymphadenopathy:    He has no cervical adenopathy.  Neurological: He is alert and oriented to person, place, and time. No cranial nerve deficit.  Skin: Skin is warm and dry. No rash noted. No erythema.  Psychiatric: He has a normal mood and affect. His behavior is normal.    Vital signs in last 24 hours: @VSRANGES @  Labs:   Estimated body mass index is 29.06 kg/m as calculated from the following:   Height as of 05/26/18: 6\' 2"  (1.88 m).   Weight as of 05/26/18: 102.6 kg (226 lb 4.8 oz).   Imaging Review Plain radiographs demonstrate moderate degenerative joint disease of the left knee(s). The overall alignment isneutral. The bone quality appears to be good for age and reported activity level.   Preoperative templating of the joint replacement has been completed, documented, and submitted to the Operating Room personnel in order to optimize intra-operative equipment management.   Anticipated LOS equal to or greater than 2 midnights due to - Age 65 and older with one or more of the following:  - Obesity  - Expected need for hospital services (PT, OT, Nursing) required for safe  discharge  - Anticipated need for postoperative skilled nursing care or inpatient rehab  - Active co-morbidities: None OR   - Unanticipated findings during/Post Surgery: None  - Patient is a high risk of re-admission due to: None     Assessment/Plan:  End stage arthritis, left knee   The patient history, physical examination, clinical judgment of the provider and imaging studies are consistent with end stage degenerative joint disease of the left knee(s) and total knee arthroplasty is deemed medically necessary. The treatment options including medical management, injection therapy  arthroscopy and arthroplasty were discussed at length. The risks and benefits of total knee arthroplasty were presented and reviewed. The risks due to aseptic loosening, infection, stiffness, patella tracking problems, thromboembolic complications and other imponderables were discussed. The patient acknowledged the explanation, agreed to proceed with the plan and consent was signed. Patient is being admitted for inpatient treatment for surgery, pain control, PT, OT, prophylactic antibiotics, VTE prophylaxis, progressive ambulation and ADL's and discharge planning. The patient is planning to be discharged home with outpatient PT

## 2018-05-27 NOTE — Progress Notes (Addendum)
Anesthesia Chart Review:   Case:  [id number] Date/Time:  06/06/18 0945   Procedure:  LEFT TOTAL KNEE ARTHROPLASTY (Left Knee)   Anesthesia type:  Choice   Pre-op diagnosis:  OA LEFT KNEE   Location:  [facility] ROOM 07 / [facility]   Surgeon:  Frederico Hammanaffrey, Daniel, MD      DISCUSSION: - Pt is a 62 year old male with hx PVD (s/p L FPBG 2008), HTN, OSA   VS: BP 139/79   Pulse 62   Temp 36.4 C   Resp 20   Ht 6\' 2"  (1.88 m)   Wt 226 lb 4.8 oz (102.6 kg)   SpO2 100%   BMI 29.06 kg/m    PROVIDERS: PCP is Kem BoroughsArnold, Terry, MD. Pt cleared for surgery at last office visit 04/28/18 with Mayra ReelPenny Taylor, PA.    LABS: Labs reviewed: Acceptable for surgery. (all labs ordered are listed, but only abnormal results are displayed)  Labs Reviewed  CBC WITH DIFFERENTIAL/PLATELET - Abnormal; Notable for the following components:      Result Value   WBC 12.1 (*)    Hemoglobin 18.2 (*)    HCT 53.5 (*)    Lymphs Abs 4.1 (*)    All other components within normal limits  COMPREHENSIVE METABOLIC PANEL - Abnormal; Notable for the following components:   Glucose, Bld 105 (*)    AST 48 (*)    ALT 51 (*)    All other components within normal limits  URINALYSIS, ROUTINE W REFLEX MICROSCOPIC - Abnormal; Notable for the following components:   APPearance HAZY (*)    All other components within normal limits  URINE CULTURE  SURGICAL PCR SCREEN  APTT  PROTIME-INR  TYPE AND SCREEN     IMAGES:  CXR 11/05/17 (care everywhere): no evidence of acute cardiac or pulmonary abnormality   EKG 04/28/18 (PCP's office): sinus bradycardia (57 bpm). Lateral infarct, age undetermined. Inferior infarct, age undetermined.  - PCP interprets EKG as "stable and unchanged"   CV:  Exercise treadmill test 02/20/16 [facility]):  1. Hypertensive BP response to exercise 2. No subjective or objective signs of ischemia in normal TST 3. Multifocal PVCs/ one couplet are of unknown clinical significance   Past Medical  History:  Diagnosis Date  . Arthritis   . Family history of adverse reaction to anesthesia    "mother allergic to one of them, don't know what it was"  . Hypertension   . Hypothyroidism   . PAD (peripheral artery disease) (HCC)    had surgery by Dr. Phillips GroutJ  Lawson  . Sleep apnea    positive......Marland Kitchen.wears mask    Past Surgical History:  Procedure Laterality Date  . APPENDECTOMY    . arthroscopies     bilateral knees   2 & 3 times  . TONSILLECTOMY    . VASCULAR SURGERY     x 2       MEDICATIONS: . amLODipine (NORVASC) 5 MG tablet  . Cholecalciferol (VITAMIN D3) 5000 units TABS  . diazepam (VALIUM) 5 MG tablet  . diclofenac (VOLTAREN) 75 MG EC tablet  . gabapentin (NEURONTIN) 300 MG capsule  . levocetirizine (XYZAL) 5 MG tablet  . levothyroxine (SYNTHROID, LEVOTHROID) 50 MCG tablet  . Multiple Vitamin (MULTIVITAMIN) tablet  . propranolol ER (INDERAL LA) 60 MG 24 hr capsule  . rosuvastatin (CRESTOR) 10 MG tablet  . venlafaxine XR (EFFEXOR-XR) 150 MG 24 hr capsule  . vitamin E 1000 UNIT capsule  . zolpidem (AMBIEN) 10 MG  tablet   No current facility-administered medications for this encounter.     If no changes, I anticipate pt can proceed with surgery as scheduled.   Rica Mast, FNP-BC [facility]/Anesthesiology Phone: [phone] 06/05/2018 12:06 PM

## 2018-05-27 NOTE — H&P (Signed)
TOTAL KNEE ADMISSION H&P  Patient is being admitted for left total knee arthroplasty.  Subjective:  Chief Complaint:left knee pain.  HPI: [name], 62 y.o. male, has a history of pain and functional disability in the left knee due to arthritis and has failed non-surgical conservative treatments for greater than 12 weeks to includeNSAID's and/or analgesics, corticosteriod injections, viscosupplementation injections and activity modification.  Onset of symptoms was gradual, starting >10 years ago with rapidlly worsening course since that time. The patient noted no past surgery on the left knee(s).  Patient currently rates pain in the left knee(s) at 10 out of 10 with activity. Patient has night pain, worsening of pain with activity and weight bearing, pain that interferes with activities of daily living, pain with passive range of motion, crepitus and joint swelling.  Patient has evidence of periarticular osteophytes and joint space narrowing by imaging studies.  There is no active infection.  There are no active problems to display for this patient.  Past Medical History:  Diagnosis Date  . Arthritis   . Family history of adverse reaction to anesthesia    "mother allergic to one of them, don't know what it was"  . Hypertension   . Hypothyroidism   . PAD (peripheral artery disease) (HCC)    had surgery by Dr. J  Lawson  . Sleep apnea    positive.......wears mask    Past Surgical History:  Procedure Laterality Date  . APPENDECTOMY    . arthroscopies     bilateral knees   2 & 3 times  . TONSILLECTOMY    . VASCULAR SURGERY     x 2       Current Outpatient Medications  Medication Sig Dispense Refill Last Dose  . amLODipine (NORVASC) 5 MG tablet Take 5 mg by mouth 2 (two) times daily.  2   . Cholecalciferol (VITAMIN D3) 5000 units TABS Take 5,000 Units by mouth daily.     . diazepam (VALIUM) 5 MG tablet Take 5 mg by mouth 3 (three) times daily as needed for anxiety.  0   .  diclofenac (VOLTAREN) 75 MG EC tablet Take 75 mg by mouth 2 (two) times daily.  2   . gabapentin (NEURONTIN) 300 MG capsule Take 300 mg by mouth 3 (three) times daily as needed for pain.  1   . levocetirizine (XYZAL) 5 MG tablet Take 5 mg by mouth daily.  1   . levothyroxine (SYNTHROID, LEVOTHROID) 50 MCG tablet Take 50 mcg by mouth every morning.  3   . Multiple Vitamin (MULTIVITAMIN) tablet Take 2 tablets by mouth daily.     . propranolol ER (INDERAL LA) 60 MG 24 hr capsule Take 60 mg by mouth 2 (two) times daily.  3   . rosuvastatin (CRESTOR) 10 MG tablet Take 10 mg by mouth daily.  1   . venlafaxine XR (EFFEXOR-XR) 150 MG 24 hr capsule Take 150 mg by mouth daily.  3   . vitamin E 1000 UNIT capsule Take 1,000 Units by mouth daily.     . zolpidem (AMBIEN) 10 MG tablet Take 10 mg by mouth daily.  0    No current facility-administered medications for this visit.    No Known Allergies  Social History   Tobacco Use  . Smoking status: Current Some Day Smoker    Packs/day: 0.50    Years: 35.00    Pack years: 17.50    Types: Cigarettes  . Smokeless tobacco: Never Used  Substance   Use Topics  . Alcohol use: Yes    Alcohol/week: 4.2 oz    Types: 7 Glasses of wine per week    No family history on file.   Review of Systems  Musculoskeletal: Positive for joint pain.  Endo/Heme/Allergies: Bruises/bleeds easily.  All other systems reviewed and are negative.   Objective:  Physical Exam  Constitutional: He is oriented to person, place, and time. He appears well-developed and well-nourished. No distress.  HENT:  Head: Normocephalic and atraumatic.  Nose: Nose normal.  Eyes: Pupils are equal, round, and reactive to light. Conjunctivae and EOM are normal.  Neck: Normal range of motion. Neck supple.  Cardiovascular: Normal rate, regular rhythm, normal heart sounds and intact distal pulses.  Respiratory: Effort normal and breath sounds normal. No respiratory distress. He has no wheezes.   GI: Soft. Bowel sounds are normal. He exhibits no distension. There is no tenderness.  Musculoskeletal:       Left knee: He exhibits decreased range of motion. He exhibits no effusion and no erythema. Tenderness found.  Lymphadenopathy:    He has no cervical adenopathy.  Neurological: He is alert and oriented to person, place, and time. No cranial nerve deficit.  Skin: Skin is warm and dry. No rash noted. No erythema.  Psychiatric: He has a normal mood and affect. His behavior is normal.    Vital signs in last 24 hours: @VSRANGES@  Labs:   Estimated body mass index is 29.06 kg/m as calculated from the following:   Height as of 05/26/18: 6' 2" (1.88 m).   Weight as of 05/26/18: 102.6 kg (226 lb 4.8 oz).   Imaging Review Plain radiographs demonstrate moderate degenerative joint disease of the left knee(s). The overall alignment isneutral. The bone quality appears to be good for age and reported activity level.   Preoperative templating of the joint replacement has been completed, documented, and submitted to the Operating Room personnel in order to optimize intra-operative equipment management.   Anticipated LOS equal to or greater than 2 midnights due to - Age 63 and older with one or more of the following:  - Obesity  - Expected need for hospital services (PT, OT, Nursing) required for safe  discharge  - Anticipated need for postoperative skilled nursing care or inpatient rehab  - Active co-morbidities: None OR   - Unanticipated findings during/Post Surgery: None  - Patient is a high risk of re-admission due to: None     Assessment/Plan:  End stage arthritis, left knee   The patient history, physical examination, clinical judgment of the provider and imaging studies are consistent with end stage degenerative joint disease of the left knee(s) and total knee arthroplasty is deemed medically necessary. The treatment options including medical management, injection therapy  arthroscopy and arthroplasty were discussed at length. The risks and benefits of total knee arthroplasty were presented and reviewed. The risks due to aseptic loosening, infection, stiffness, patella tracking problems, thromboembolic complications and other imponderables were discussed. The patient acknowledged the explanation, agreed to proceed with the plan and consent was signed. Patient is being admitted for inpatient treatment for surgery, pain control, PT, OT, prophylactic antibiotics, VTE prophylaxis, progressive ambulation and ADL's and discharge planning. The patient is planning to be discharged home with outpatient PT   

## 2018-06-05 MED ORDER — BUPIVACAINE LIPOSOME 1.3 % IJ SUSP
20.0000 mL | Freq: Once | INTRAMUSCULAR | Status: DC
  Filled 2018-06-05: qty 20

## 2018-06-05 MED ORDER — TRANEXAMIC ACID 1000 MG/10ML IV SOLN
1000.0000 mg | INTRAVENOUS | Status: AC
  Administered 2018-06-06: 1000 mg via INTRAVENOUS
  Filled 2018-06-05: qty 1100

## 2018-06-05 MED ORDER — TRANEXAMIC ACID 1000 MG/10ML IV SOLN
2000.0000 mg | INTRAVENOUS | Status: DC
  Filled 2018-06-05: qty 20

## 2018-06-06 ENCOUNTER — Inpatient Hospital Stay (HOSPITAL_COMMUNITY): Admitting: Anesthesiology

## 2018-06-06 ENCOUNTER — Inpatient Hospital Stay (HOSPITAL_COMMUNITY)
Admission: RE | Admit: 2018-06-06 | Discharge: 2018-06-07 | DRG: 470 | Disposition: A | Discharge status: Home / Self Care | Attending: Orthopedic Surgery | Admitting: Orthopedic Surgery

## 2018-06-06 ENCOUNTER — Encounter (HOSPITAL_COMMUNITY): Payer: Self-pay | Admitting: Emergency Medicine

## 2018-06-06 ENCOUNTER — Inpatient Hospital Stay (HOSPITAL_COMMUNITY): Admitting: Emergency Medicine

## 2018-06-06 ENCOUNTER — Other Ambulatory Visit: Payer: Self-pay

## 2018-06-06 ENCOUNTER — Encounter (HOSPITAL_COMMUNITY)
Admission: RE | Disposition: A | Discharge status: Home / Self Care | Payer: Self-pay | Source: Home / Self Care | Attending: Orthopedic Surgery

## 2018-06-06 DIAGNOSIS — M1712 Unilateral primary osteoarthritis, left knee: Secondary | ICD-10-CM | POA: Diagnosis present

## 2018-06-06 DIAGNOSIS — Z79890 Hormone replacement therapy (postmenopausal): Secondary | ICD-10-CM | POA: Diagnosis not present

## 2018-06-06 DIAGNOSIS — I739 Peripheral vascular disease, unspecified: Secondary | ICD-10-CM | POA: Diagnosis present

## 2018-06-06 DIAGNOSIS — I10 Essential (primary) hypertension: Secondary | ICD-10-CM | POA: Diagnosis present

## 2018-06-06 DIAGNOSIS — Z96659 Presence of unspecified artificial knee joint: Secondary | ICD-10-CM

## 2018-06-06 DIAGNOSIS — E039 Hypothyroidism, unspecified: Secondary | ICD-10-CM | POA: Diagnosis present

## 2018-06-06 DIAGNOSIS — G4730 Sleep apnea, unspecified: Secondary | ICD-10-CM | POA: Diagnosis present

## 2018-06-06 DIAGNOSIS — F17210 Nicotine dependence, cigarettes, uncomplicated: Secondary | ICD-10-CM | POA: Diagnosis present

## 2018-06-06 HISTORY — PX: TOTAL KNEE ARTHROPLASTY: SHX125

## 2018-06-06 LAB — TYPE AND SCREEN
ABO/RH(D): A POS
Antibody Screen: NEGATIVE

## 2018-06-06 LAB — ABO/RH: ABO/RH(D): A POS

## 2018-06-06 SURGERY — ARTHROPLASTY, KNEE, TOTAL
Anesthesia: Regional | Site: Knee | Laterality: Left

## 2018-06-06 MED ORDER — OXYCODONE HCL 5 MG/5ML PO SOLN
5.0000 mg | Freq: Once | ORAL | Status: DC | PRN
  Filled 2018-06-06: qty 5

## 2018-06-06 MED ORDER — TRANEXAMIC ACID 1000 MG/10ML IV SOLN
1000.0000 mg | Freq: Once | INTRAVENOUS | Status: AC
  Administered 2018-06-06: 1000 mg via INTRAVENOUS
  Filled 2018-06-06: qty 1100

## 2018-06-06 MED ORDER — ONDANSETRON HCL 4 MG/2ML IJ SOLN
INTRAMUSCULAR | Status: DC | PRN
  Administered 2018-06-06: 4 mg via INTRAVENOUS

## 2018-06-06 MED ORDER — PROPOFOL 10 MG/ML IV BOLUS
INTRAVENOUS | Status: AC
  Filled 2018-06-06: qty 60

## 2018-06-06 MED ORDER — ACETAMINOPHEN 500 MG PO TABS
1000.0000 mg | ORAL_TABLET | Freq: Four times a day (QID) | ORAL | Status: AC
  Administered 2018-06-06 – 2018-06-07 (×4): 1000 mg via ORAL
  Filled 2018-06-06 (×4): qty 2

## 2018-06-06 MED ORDER — ACETAMINOPHEN 325 MG PO TABS: 650.0000 mg | ORAL_TABLET | ORAL | 0 refills | Status: DC | PRN

## 2018-06-06 MED ORDER — HYDROMORPHONE HCL 1 MG/ML IJ SOLN
0.2500 mg | INTRAMUSCULAR | Status: DC | PRN
  Administered 2018-06-06 (×4): 0.5 mg via INTRAVENOUS

## 2018-06-06 MED ORDER — ASPIRIN EC 81 MG PO TBEC: 81.0000 mg | DELAYED_RELEASE_TABLET | Freq: Two times a day (BID) | ORAL | 0 refills | Status: AC

## 2018-06-06 MED ORDER — FENTANYL CITRATE (PF) 100 MCG/2ML IJ SOLN
100.0000 ug | Freq: Once | INTRAMUSCULAR | Status: AC
Start: 2018-06-06 — End: 2018-06-06
  Administered 2018-06-06: 100 ug via INTRAVENOUS
  Filled 2018-06-06: qty 2

## 2018-06-06 MED ORDER — PROPRANOLOL HCL ER 60 MG PO CP24
60.0000 mg | ORAL_CAPSULE | Freq: Two times a day (BID) | ORAL | Status: DC
  Administered 2018-06-06 – 2018-06-07 (×2): 60 mg via ORAL
  Filled 2018-06-06 (×3): qty 1

## 2018-06-06 MED ORDER — LEVOTHYROXINE SODIUM 50 MCG PO TABS
50.0000 ug | ORAL_TABLET | Freq: Every day | ORAL | Status: DC
  Administered 2018-06-07: 50 ug via ORAL
  Filled 2018-06-06: qty 1

## 2018-06-06 MED ORDER — PROPOFOL 10 MG/ML IV BOLUS
INTRAVENOUS | Status: AC
  Filled 2018-06-06: qty 20

## 2018-06-06 MED ORDER — HYDROMORPHONE HCL 1 MG/ML IJ SOLN
INTRAMUSCULAR | Status: AC
  Administered 2018-06-06: 1 mg via INTRAVENOUS
  Filled 2018-06-06: qty 2

## 2018-06-06 MED ORDER — BUPIVACAINE-EPINEPHRINE (PF) 0.25% -1:200000 IJ SOLN
INTRAMUSCULAR | Status: AC
  Filled 2018-06-06: qty 60

## 2018-06-06 MED ORDER — HYDROMORPHONE HCL 1 MG/ML IJ SOLN
0.5000 mg | INTRAMUSCULAR | Status: DC | PRN
  Administered 2018-06-06: 1 mg via INTRAVENOUS
  Filled 2018-06-06: qty 1

## 2018-06-06 MED ORDER — ONDANSETRON HCL 4 MG PO TABS: 4.0000 mg | ORAL_TABLET | Freq: Four times a day (QID) | ORAL | Status: DC | PRN

## 2018-06-06 MED ORDER — BUPIVACAINE-EPINEPHRINE (PF) 0.5% -1:200000 IJ SOLN
INTRAMUSCULAR | Status: AC
  Filled 2018-06-06: qty 30

## 2018-06-06 MED ORDER — METOCLOPRAMIDE HCL 5 MG PO TABS: 5.0000 mg | ORAL_TABLET | Freq: Three times a day (TID) | ORAL | Status: DC | PRN

## 2018-06-06 MED ORDER — CEFAZOLIN SODIUM-DEXTROSE 1-4 GM/50ML-% IV SOLN
1.0000 g | Freq: Four times a day (QID) | INTRAVENOUS | Status: AC
  Administered 2018-06-06 (×2): 1 g via INTRAVENOUS
  Filled 2018-06-06 (×2): qty 50

## 2018-06-06 MED ORDER — DOCUSATE SODIUM 100 MG PO CAPS
100.0000 mg | ORAL_CAPSULE | Freq: Two times a day (BID) | ORAL | Status: DC
  Administered 2018-06-06 – 2018-06-07 (×2): 100 mg via ORAL
  Filled 2018-06-06 (×2): qty 1

## 2018-06-06 MED ORDER — SODIUM CHLORIDE 0.9 % IJ SOLN
INTRAMUSCULAR | Status: DC | PRN
  Administered 2018-06-06: 50 mL

## 2018-06-06 MED ORDER — LEVOCETIRIZINE DIHYDROCHLORIDE 5 MG PO TABS: 5.0000 mg | ORAL_TABLET | Freq: Every day | ORAL | Status: DC

## 2018-06-06 MED ORDER — CEFAZOLIN SODIUM-DEXTROSE 2-4 GM/100ML-% IV SOLN
2.0000 g | INTRAVENOUS | Status: AC
  Administered 2018-06-06: 2 g via INTRAVENOUS
  Filled 2018-06-06: qty 100

## 2018-06-06 MED ORDER — PROMETHAZINE HCL 25 MG/ML IJ SOLN
6.2500 mg | INTRAMUSCULAR | Status: DC | PRN
Start: 2018-06-06 — End: 2018-06-06

## 2018-06-06 MED ORDER — GABAPENTIN 300 MG PO CAPS
300.0000 mg | ORAL_CAPSULE | Freq: Three times a day (TID) | ORAL | Status: DC
  Administered 2018-06-06 (×2): 300 mg via ORAL
  Filled 2018-06-06 (×3): qty 1

## 2018-06-06 MED ORDER — ROSUVASTATIN CALCIUM 10 MG PO TABS
10.0000 mg | ORAL_TABLET | Freq: Every day | ORAL | Status: DC
  Administered 2018-06-06 – 2018-06-07 (×2): 10 mg via ORAL
  Filled 2018-06-06 (×2): qty 1

## 2018-06-06 MED ORDER — VENLAFAXINE HCL ER 150 MG PO CP24
150.0000 mg | ORAL_CAPSULE | Freq: Every day | ORAL | Status: DC
  Administered 2018-06-07: 150 mg via ORAL
  Filled 2018-06-06: qty 1

## 2018-06-06 MED ORDER — LACTATED RINGERS IV SOLN
INTRAVENOUS | Status: DC
  Administered 2018-06-06 (×2): via INTRAVENOUS

## 2018-06-06 MED ORDER — LORATADINE 10 MG PO TABS
10.0000 mg | ORAL_TABLET | Freq: Every day | ORAL | Status: DC
  Administered 2018-06-06: 10 mg via ORAL
  Filled 2018-06-06 (×2): qty 1

## 2018-06-06 MED ORDER — SODIUM CHLORIDE 0.9 % IV SOLN
INTRAVENOUS | Status: DC | PRN
  Administered 2018-06-06: 40 ug/min via INTRAVENOUS

## 2018-06-06 MED ORDER — ACETAMINOPHEN 325 MG PO TABS
325.0000 mg | ORAL_TABLET | Freq: Four times a day (QID) | ORAL | Status: DC | PRN
  Administered 2018-06-07: 650 mg via ORAL
  Filled 2018-06-06: qty 2

## 2018-06-06 MED ORDER — SODIUM CHLORIDE 0.9 % IV SOLN
INTRAVENOUS | Status: DC
  Administered 2018-06-06: 16:00:00 via INTRAVENOUS

## 2018-06-06 MED ORDER — PROPOFOL 500 MG/50ML IV EMUL
INTRAVENOUS | Status: DC | PRN
  Administered 2018-06-06: 100 ug/kg/min via INTRAVENOUS

## 2018-06-06 MED ORDER — OXYCODONE HCL 5 MG PO TABS: ORAL_TABLET | ORAL | 0 refills | Status: DC

## 2018-06-06 MED ORDER — OXYCODONE HCL 5 MG PO TABS
5.0000 mg | ORAL_TABLET | ORAL | Status: DC | PRN
  Administered 2018-06-06 (×2): 10 mg via ORAL
  Administered 2018-06-06: 5 mg via ORAL
  Administered 2018-06-07 (×3): 10 mg via ORAL
  Filled 2018-06-06: qty 1
  Filled 2018-06-06 (×5): qty 2

## 2018-06-06 MED ORDER — BUPIVACAINE IN DEXTROSE 0.75-8.25 % IT SOLN
INTRATHECAL | Status: DC | PRN
  Administered 2018-06-06: 2 mL via INTRATHECAL

## 2018-06-06 MED ORDER — SODIUM CHLORIDE 0.9 % IJ SOLN
INTRAMUSCULAR | Status: AC
  Filled 2018-06-06: qty 50

## 2018-06-06 MED ORDER — PROPOFOL 10 MG/ML IV BOLUS
INTRAVENOUS | Status: DC | PRN
  Administered 2018-06-06: 20 mg via INTRAVENOUS
  Administered 2018-06-06: 30 mg via INTRAVENOUS
  Administered 2018-06-06: 20 mg via INTRAVENOUS

## 2018-06-06 MED ORDER — PHENYLEPHRINE HCL 10 MG/ML IJ SOLN
INTRAMUSCULAR | Status: AC
  Filled 2018-06-06: qty 1

## 2018-06-06 MED ORDER — SODIUM CHLORIDE 0.9 % IR SOLN
Status: DC | PRN
  Administered 2018-06-06: 3000 mL

## 2018-06-06 MED ORDER — SODIUM CHLORIDE 0.9 % IV SOLN: INTRAVENOUS | Status: DC

## 2018-06-06 MED ORDER — BISACODYL 10 MG RE SUPP: 10.0000 mg | Freq: Every day | RECTAL | Status: DC | PRN

## 2018-06-06 MED ORDER — POLYETHYLENE GLYCOL 3350 17 G PO PACK: 17.0000 g | PACK | Freq: Every day | ORAL | Status: DC | PRN

## 2018-06-06 MED ORDER — ZOLPIDEM TARTRATE 10 MG PO TABS
10.0000 mg | ORAL_TABLET | Freq: Every day | ORAL | Status: DC
  Administered 2018-06-06: 10 mg via ORAL
  Filled 2018-06-06: qty 1

## 2018-06-06 MED ORDER — MENTHOL 3 MG MT LOZG: 1.0000 | LOZENGE | OROMUCOSAL | Status: DC | PRN

## 2018-06-06 MED ORDER — ROPIVACAINE HCL 5 MG/ML IJ SOLN
INTRAMUSCULAR | Status: DC | PRN
  Administered 2018-06-06: 20 mL via PERINEURAL

## 2018-06-06 MED ORDER — AMLODIPINE BESYLATE 5 MG PO TABS
5.0000 mg | ORAL_TABLET | Freq: Two times a day (BID) | ORAL | Status: DC
  Administered 2018-06-06 – 2018-06-07 (×2): 5 mg via ORAL
  Filled 2018-06-06 (×2): qty 1

## 2018-06-06 MED ORDER — MIDAZOLAM HCL 2 MG/2ML IJ SOLN
2.0000 mg | Freq: Once | INTRAMUSCULAR | Status: AC
  Administered 2018-06-06: 2 mg via INTRAVENOUS
  Filled 2018-06-06: qty 2

## 2018-06-06 MED ORDER — 0.9 % SODIUM CHLORIDE (POUR BTL) OPTIME
TOPICAL | Status: DC | PRN
  Administered 2018-06-06: 1000 mL

## 2018-06-06 MED ORDER — BUPIVACAINE LIPOSOME 1.3 % IJ SUSP
INTRAMUSCULAR | Status: DC | PRN
  Administered 2018-06-06: 20 mL

## 2018-06-06 MED ORDER — ASPIRIN 81 MG PO CHEW
81.0000 mg | CHEWABLE_TABLET | Freq: Two times a day (BID) | ORAL | Status: DC
  Administered 2018-06-06 – 2018-06-07 (×2): 81 mg via ORAL
  Filled 2018-06-06 (×2): qty 1

## 2018-06-06 MED ORDER — OXYCODONE HCL 5 MG PO TABS: 5.0000 mg | ORAL_TABLET | Freq: Once | ORAL | Status: DC | PRN

## 2018-06-06 MED ORDER — ONDANSETRON HCL 4 MG/2ML IJ SOLN
INTRAMUSCULAR | Status: AC
  Filled 2018-06-06: qty 2

## 2018-06-06 MED ORDER — BUPIVACAINE-EPINEPHRINE 0.25% -1:200000 IJ SOLN
INTRAMUSCULAR | Status: DC | PRN
  Administered 2018-06-06: 50 mL

## 2018-06-06 MED ORDER — ONDANSETRON HCL 4 MG/2ML IJ SOLN: 4.0000 mg | Freq: Four times a day (QID) | INTRAMUSCULAR | Status: DC | PRN

## 2018-06-06 MED ORDER — DIAZEPAM 5 MG PO TABS
5.0000 mg | ORAL_TABLET | Freq: Three times a day (TID) | ORAL | Status: DC | PRN
  Administered 2018-06-07 (×2): 5 mg via ORAL
  Filled 2018-06-06 (×2): qty 1

## 2018-06-06 MED ORDER — PHENOL 1.4 % MT LIQD: 1.0000 | OROMUCOSAL | Status: DC | PRN

## 2018-06-06 MED ORDER — CHLORHEXIDINE GLUCONATE 4 % EX LIQD: 60.0000 mL | Freq: Once | CUTANEOUS | Status: DC

## 2018-06-06 MED ORDER — METOCLOPRAMIDE HCL 5 MG/ML IJ SOLN: 5.0000 mg | Freq: Three times a day (TID) | INTRAMUSCULAR | Status: DC | PRN

## 2018-06-06 MED ORDER — FLEET ENEMA 7-19 GM/118ML RE ENEM: 1.0000 | ENEMA | Freq: Once | RECTAL | Status: DC | PRN

## 2018-06-06 SURGICAL SUPPLY — 63 items
BAG SPEC THK2 15X12 ZIP CLS (MISCELLANEOUS) ×1
BAG ZIPLOCK 12X15 (MISCELLANEOUS) ×3 IMPLANT
BANDAGE ACE 4X5 VEL STRL LF (GAUZE/BANDAGES/DRESSINGS) ×1 IMPLANT
BANDAGE ACE 6X5 VEL STRL LF (GAUZE/BANDAGES/DRESSINGS) ×1 IMPLANT
BANDAGE ELASTIC 6 VELCRO ST LF (GAUZE/BANDAGES/DRESSINGS) ×2 IMPLANT
BANDAGE ESMARK 6X9 LF (GAUZE/BANDAGES/DRESSINGS) ×1 IMPLANT
BLADE SAW SAG 73X25 THK (BLADE) ×2
BLADE SAW SAG 90X13X1.27 (BLADE) ×2 IMPLANT
BLADE SAW SGTL 73X25 THK (BLADE) ×1 IMPLANT
BNDG CMPR 9X6 STRL LF SNTH (GAUZE/BANDAGES/DRESSINGS) ×1
BNDG ESMARK 6X9 LF (GAUZE/BANDAGES/DRESSINGS) ×3
BNDG GAUZE ELAST 4 BULKY (GAUZE/BANDAGES/DRESSINGS) ×3 IMPLANT
BOWL SMART MIX CTS (DISPOSABLE) ×3 IMPLANT
CAP KNEE TOTAL 3 SIGMA ×2 IMPLANT
CEMENT BONE DEPUY (Cement) ×6 IMPLANT
CLOSURE WOUND 1/2 X4 (GAUZE/BANDAGES/DRESSINGS) ×1
COVER SURGICAL LIGHT HANDLE (MISCELLANEOUS) ×3 IMPLANT
CUFF TOURN SGL QUICK 34 (TOURNIQUET CUFF) ×3
CUFF TRNQT CYL 34X4X40X1 (TOURNIQUET CUFF) ×1 IMPLANT
DECANTER SPIKE VIAL GLASS SM (MISCELLANEOUS) ×3 IMPLANT
DRAPE POUCH INSTRU U-SHP 10X18 (DRAPES) ×3 IMPLANT
DRAPE U-SHAPE 47X51 STRL (DRAPES) ×3 IMPLANT
DRSG ADAPTIC 3X8 NADH LF (GAUZE/BANDAGES/DRESSINGS) ×1 IMPLANT
DRSG PAD ABDOMINAL 8X10 ST (GAUZE/BANDAGES/DRESSINGS) ×3 IMPLANT
DURAPREP 26ML APPLICATOR (WOUND CARE) ×5 IMPLANT
ELECT REM PT RETURN 15FT ADLT (MISCELLANEOUS) ×3 IMPLANT
EVACUATOR 1/8 PVC DRAIN (DRAIN) ×3 IMPLANT
FACESHIELD WRAPAROUND (MASK) ×15 IMPLANT
FACESHIELD WRAPAROUND OR TEAM (MASK) ×5 IMPLANT
GAUZE SPONGE 4X4 12PLY STRL (GAUZE/BANDAGES/DRESSINGS) ×3 IMPLANT
GLOVE BIO SURGEON STRL SZ7.5 (GLOVE) ×3 IMPLANT
GLOVE SURG ORTHO 8.0 STRL STRW (GLOVE) ×3 IMPLANT
GOWN STRL REUS W/TWL XL LVL3 (GOWN DISPOSABLE) ×6 IMPLANT
HANDPIECE INTERPULSE COAX TIP (DISPOSABLE) ×3
HOLDER FOLEY CATH W/STRAP (MISCELLANEOUS) IMPLANT
IMMOBILIZER KNEE 22  40 CIR (ORTHOPEDIC SUPPLIES) ×2
IMMOBILIZER KNEE 22 40 CIR (ORTHOPEDIC SUPPLIES) IMPLANT
KIT BASIN OR (CUSTOM PROCEDURE TRAY) ×3 IMPLANT
MANIFOLD NEPTUNE II (INSTRUMENTS) ×3 IMPLANT
NDL SAFETY ECLIPSE 18X1.5 (NEEDLE) IMPLANT
NEEDLE HYPO 18GX1.5 SHARP (NEEDLE)
NEEDLE HYPO 22GX1.5 SAFETY (NEEDLE) ×3 IMPLANT
NS IRRIG 1000ML POUR BTL (IV SOLUTION) ×3 IMPLANT
PAD CAST 4YDX4 CTTN HI CHSV (CAST SUPPLIES) ×1 IMPLANT
PADDING CAST COTTON 4X4 STRL (CAST SUPPLIES) ×3
PADDING CAST COTTON 6X4 STRL (CAST SUPPLIES) ×2 IMPLANT
POSITIONER SURGICAL ARM (MISCELLANEOUS) ×3 IMPLANT
SET HNDPC FAN SPRY TIP SCT (DISPOSABLE) ×1 IMPLANT
SPONGE LAP 18X18 RF (DISPOSABLE) ×3 IMPLANT
STAPLER VISISTAT 35W (STAPLE) IMPLANT
STRIP CLOSURE SKIN 1/2X4 (GAUZE/BANDAGES/DRESSINGS) ×1 IMPLANT
SUCTION FRAZIER HANDLE 12FR (TUBING) ×2
SUCTION TUBE FRAZIER 12FR DISP (TUBING) ×1 IMPLANT
SUT ETHIBOND NAB CT1 #1 30IN (SUTURE) ×18 IMPLANT
SUT MNCRL AB 3-0 PS2 18 (SUTURE) ×2 IMPLANT
SUT VIC AB 2-0 CT1 27 (SUTURE) ×6
SUT VIC AB 2-0 CT1 27XBRD (SUTURE) ×2 IMPLANT
SYR 30ML LL (SYRINGE) ×3 IMPLANT
SYR 3ML LL SCALE MARK (SYRINGE) ×3 IMPLANT
TOWEL OR 17X26 10 PK STRL BLUE (TOWEL DISPOSABLE) ×9 IMPLANT
TRAY FOLEY MTR SLVR 16FR STAT (SET/KITS/TRAYS/PACK) ×3 IMPLANT
WATER STERILE IRR 1000ML POUR (IV SOLUTION) ×3 IMPLANT
YANKAUER SUCT BULB TIP NO VENT (SUCTIONS) ×2 IMPLANT

## 2018-06-06 NOTE — Op Note (Signed)
NAME: [name], Wyatt W. MEDICAL RECORD [id number] ACCOUNT [id number] DATE OF BIRTH:[birth year] FACILITY: [facility] LOCATION: [id number] PHYSICIAN:W. Augustino Savastano JR., MD  OPERATIVE REPORT  DATE OF PROCEDURE:  06/06/2018  PREOPERATIVE DIAGNOSIS:  Severe osteoarthritis, left knee.  PROCEDURE:  Left total knee replacement (DePuy Sigma knee with size 4 femur, size 5 tibia, 10 mm size 4 tibial bearing with 41 mm all poly patella.)  SURGEON:  W Carloyn Manneraffrey Jr. MD  ASSISTANT:   Gweneth FritterJoshua Cadwell, PA.  ANESTHESIA:  Spinal anesthetic with adductor block, local.  TOURNIQUET TIME:  79 minutes.  DESCRIPTION OF PROCEDURE:  Sterile prep and drape and exsanguination of the leg, was placed in the tourniquet to 350.  Straight skin incision was made with a medial parapatellar approach to the knee.  We did an 11 mm 5-degree valgus cut of the femur and  then cut about 3-4 mm below the most diseased medial compartment; however, we did a second provisional cut to allow full extension in view of the patient's preoperative flexion contracture.  Extension gap measured at 10 mm.  Femur was sized to be a size  4, followed by placing all in 1 cutting block the anterior, posterior chamfer cuts.  The keel hole was cut for the tibia.  Complete release of the PCL was carried out with stripping of the posterior osteophytes of the back of the knee.  We trialed the  tibia and the femur with a 10 mm bearing.  Full extension was obtained.  Excellent stability to varus and valgus and no tendency for bearing spinout was noted.  The patella was cut, leaving about 17 mm patella for a 41 mm trial.  All trials were placed.   All parameters deemed to be acceptable.  Cement was prepared on the back table, and in the doughy state was inserted tibia followed by femur and patella.  Cement was allowed to harden with the trial bearing in.  We then removed the trial bearing.  Small  bits of cement were removed.  The tourniquet was released.  Under  direct vision, no excessive bleeding was noted.  Final bearing was placed.  Closure was affected with #1 Ethibond, 2-0 Vicryl and Monocryl in the skin and was taken to recovery room in  stable condition.  TN/NUANCE  D:06/06/2018 T:06/06/2018 JOB:[id number]

## 2018-06-06 NOTE — Transfer of Care (Signed)
Immediate Anesthesia Transfer of Care Note  Patient: [name]  Procedure(s) Performed: Procedure(s): LEFT TOTAL KNEE ARTHROPLASTY (Left)  Patient Location: PACU  Anesthesia Type:Spinal  Level of Consciousness:  sedated, patient cooperative and responds to stimulation  Airway & Oxygen Therapy:Patient Spontanous Breathing and Patient connected to face mask oxgen  Post-op Assessment:  Report given to PACU RN and Post -op Vital signs reviewed and stable  Post vital signs:  Reviewed and stable  Last Vitals:  Vitals:   06/06/18 0941 06/06/18 0942  BP:    Pulse: (!) 55 (!) 55  Resp: 12 14  Temp:    SpO2: 99% 98%    Complications: No apparent anesthesia complications

## 2018-06-06 NOTE — Progress Notes (Signed)
Assisted Dr. Miller with left, ultrasound guided, adductor canal block. Side rails up, monitors on throughout procedure. See vital signs in flow sheet. Tolerated Procedure well.  

## 2018-06-06 NOTE — Discharge Summary (Signed)
PATIENT ID: [name]        MRN:  [id number]          DOB/AGE: August 19, 1956 / 62 y.o.    DISCHARGE SUMMARY  ADMISSION DATE:    06/06/2018 DISCHARGE DATE:   06/07/2018  ADMISSION DIAGNOSIS: OA LEFT KNEE    DISCHARGE DIAGNOSIS:  OA LEFT KNEE    ADDITIONAL DIAGNOSIS: Active Problems:   * No active hospital problems. *  Past Medical History:  Diagnosis Date  . Arthritis   . Family history of adverse reaction to anesthesia    "mother allergic to one of them, don't know what it was"  . Hypertension   . Hypothyroidism   . PAD (peripheral artery disease) (HCC)    had surgery by Dr. Phillips GroutJ  Lawson  . Sleep apnea    positive......Marland Kitchen.wears mask    PROCEDURE: Procedure(s): LEFT TOTAL KNEE ARTHROPLASTY Left on 06/06/2018  CONSULTS: PT/OT    HISTORY:  See H&P in chart  HOSPITAL COURSE:  Wyatt Rollsdward W [place]ita is a 62 y.o. admitted on 06/06/2018 and found to have a diagnosis of OA LEFT KNEE.  After appropriate laboratory studies were obtained  they were taken to the operating room on 06/06/2018 and underwent  Procedure(s): LEFT TOTAL KNEE ARTHROPLASTY  Left.   They were given perioperative antibiotics:  Anti-infectives (From admission, onward)   Start     Dose/Rate Route Frequency Ordered Stop   06/06/18 0830  ceFAZolin (ANCEF) IVPB 2g/100 mL premix     2 g 200 mL/hr over 30 Minutes Intravenous On call to O.R. 06/06/18 0827 06/06/18 1050    .  Tolerated the procedure well.  Placed with a foley intraoperatively.      POD #1, allowed out of bed to a chair.  PT for ambulation and exercise program.  Foley D/C'd in morning.  IV saline locked.  O2 discontionued.   The remainder of the hospital course was dedicated to ambulation and strengthening.   The patient was discharged on post op day 1 in  Stable condition.  Blood products given:none  DIAGNOSTIC STUDIES: Recent vital signs:  Patient Vitals for the past 24 hrs:  BP Temp Temp src Pulse Resp SpO2 Height Weight  06/06/18 0942 - - - (!)  55 14 98 % - -  06/06/18 0941 - - - (!) 55 12 99 % - -  06/06/18 0940 (!) 142/90 - - (!) 55 12 97 % - -  06/06/18 0939 - - - (!) 55 11 99 % - -  06/06/18 0938 - - - (!) 55 10 98 % - -  06/06/18 0937 - - - (!) 54 (!) 8 99 % - -  06/06/18 0936 - - - (!) 54 (!) 7 100 % - -  06/06/18 0935 (!) 137/92 - - (!) 53 (!) 7 100 % - -  06/06/18 0934 - - - (!) 53 14 100 % - -  06/06/18 0933 - - - (!) 53 14 100 % - -  06/06/18 0932 - - - (!) 53 13 100 % - -  06/06/18 0931 - - - (!) 53 (!) 8 100 % - -  06/06/18 0930 (!) 130/98 - - (!) 53 12 100 % - -  06/06/18 0929 - - - (!) 54 12 100 % - -  06/06/18 0928 - - - (!) 54 15 100 % - -  06/06/18 0927 - - - (!) 53 (!) 9 100 % - -  06/06/18 0926 - - Marland Kitchen- (!)  55 17 100 % - -  06/06/18 0925 (!) 138/93 - - (!) 53 13 100 % - -  06/06/18 0924 - - - (!) 54 12 100 % - -  06/06/18 0923 - - - (!) 54 12 100 % - -  06/06/18 0922 - - - (!) 53 15 100 % - -  06/06/18 0921 - - - (!) 55 14 100 % - -  06/06/18 0920 (!) 147/92 - - (!) 54 15 100 % - -  06/06/18 0919 - - - (!) 53 10 100 % - -  06/06/18 0918 (!) 155/93 - - (!) 54 13 100 % - -  06/06/18 0851 (!) 144/98 97.8 F (36.6 C) Oral (!) 53 18 100 % - -  06/06/18 0843 - - - - - - 6\' 2"  (1.88 m) 102.5 kg (226 lb)       Recent laboratory studies: No results for input(s): WBC, HGB, HCT, PLT in the last 168 hours. No results for input(s): NA, K, CL, CO2, BUN, CREATININE, GLUCOSE, CALCIUM in the last 168 hours. Lab Results  Component Value Date   INR 0.98 05/26/2018   INR 0.9 08/01/2007   INR 0.9 07/25/2007     Recent Radiographic Studies :  No results found.  DISCHARGE INSTRUCTIONS:   DISCHARGE MEDICATIONS:   Allergies as of 06/06/2018   No Known Allergies     Medication List    STOP taking these medications   diclofenac 75 MG EC tablet Commonly known as:  VOLTAREN     TAKE these medications   acetaminophen 325 MG tablet Commonly known as:  TYLENOL Take 2 tablets (650 mg total) by mouth every 4 (four)  hours as needed.   amLODipine 5 MG tablet Commonly known as:  NORVASC Take 5 mg by mouth 2 (two) times daily.   aspirin EC 81 MG tablet Take 1 tablet (81 mg total) by mouth 2 (two) times daily.   diazepam 5 MG tablet Commonly known as:  VALIUM Take 5 mg by mouth 3 (three) times daily as needed for anxiety.   gabapentin 300 MG capsule Commonly known as:  NEURONTIN Take 300 mg by mouth 3 (three) times daily as needed for pain.   levocetirizine 5 MG tablet Commonly known as:  XYZAL Take 5 mg by mouth daily.   levothyroxine 50 MCG tablet Commonly known as:  SYNTHROID, LEVOTHROID Take 50 mcg by mouth every morning.   multivitamin tablet Take 2 tablets by mouth daily.   oxyCODONE 5 MG immediate release tablet Commonly known as:  Oxy IR/ROXICODONE 1 tab po q4-6hrs prn pain, may need 1-2 for the first few weeks   propranolol ER 60 MG 24 hr capsule Commonly known as:  INDERAL LA Take 60 mg by mouth 2 (two) times daily.   rosuvastatin 10 MG tablet Commonly known as:  CRESTOR Take 10 mg by mouth daily.   venlafaxine XR 150 MG 24 hr capsule Commonly known as:  EFFEXOR-XR Take 150 mg by mouth daily.   Vitamin D3 5000 units Tabs Take 5,000 Units by mouth daily.   vitamin E 1000 UNIT capsule Take 1,000 Units by mouth daily.   zolpidem 10 MG tablet Commonly known as:  AMBIEN Take 10 mg by mouth daily.       FOLLOW UP VISIT:   Follow-up Information    Frederico Hamman, MD. Schedule an appointment as soon as possible for a visit in 2 weeks.   Specialty:  Orthopedic Surgery Contact information:  [address] [place] [address] [phone]           DISPOSITION:   Home  CONDITION:  Stable   Margart Sickles, PA-C  06/06/2018 1:29 PM

## 2018-06-06 NOTE — Discharge Instructions (Signed)

## 2018-06-06 NOTE — Anesthesia Procedure Notes (Signed)
Anesthesia Regional Block: Adductor canal block   Pre-Anesthetic Checklist: ,, timeout performed, Correct Patient, Correct Site, Correct Laterality, Correct Procedure, Correct Position, site marked, Risks and benefits discussed,  Surgical consent,  Pre-op evaluation,  At surgeon's request and post-op pain management  Laterality: Left  Prep: chloraprep       Needles:  Injection technique: Single-shot  Needle Type: Stimiplex     Needle Length: 9cm  Needle Gauge: 21     Additional Needles:   Procedures:,,,, ultrasound used (permanent image in chart),,,,  Narrative:  Start time: 06/06/2018 9:20 AM End time: 06/06/2018 9:25 AM Injection made incrementally with aspirations every 5 mL.  Performed by: Personally  Anesthesiologist: Lowella CurbMiller, Warren Ray, MD

## 2018-06-06 NOTE — Interval H&P Note (Signed)
History and Physical Interval Note:  06/06/2018 9:29 AM  [name]  has presented today for surgery, with the diagnosis of OA LEFT KNEE  The various methods of treatment have been discussed with the patient and family. After consideration of risks, benefits and other options for treatment, the patient has consented to  Procedure(s): LEFT TOTAL KNEE ARTHROPLASTY (Left) as a surgical intervention .  The patient's history has been reviewed, patient examined, no change in status, stable for surgery.  I have reviewed the patient's chart and labs.  Questions were answered to the patient's satisfaction.     Thera FlakeW D Arcadia Gorgas Jr

## 2018-06-06 NOTE — Brief Op Note (Signed)
06/06/2018  1:13 PM  PATIENT:  [name]  62 y.o. male  PRE-OPERATIVE DIAGNOSIS:  OA LEFT KNEE  POST-OPERATIVE DIAGNOSIS:  OA LEFT KNEE  PROCEDURE:  Procedure(s): LEFT TOTAL KNEE ARTHROPLASTY (Left)  SURGEON:  Surgeon(s) and Role:    Frederico Hamman* Caffrey, Daniel, MD - Primary  PHYSICIAN ASSISTANT: Margart SicklesJoshua Dayquan Buys, PA-C  ASSISTANTS:    ANESTHESIA:   local, regional and spinal  EBL:  minimal  BLOOD ADMINISTERED:none  DRAINS: none   LOCAL MEDICATIONS USED:  MARCAINE     SPECIMEN:  No Specimen  DISPOSITION OF SPECIMEN:  N/A  COUNTS:  YES  TOURNIQUET:   Total Tourniquet Time Documented: Thigh (Left) - 84 minutes Total: Thigh (Left) - 84 minutes   DICTATION: .Other Dictation: Dictation Number Unknown  PLAN OF CARE: Admit for overnight observation  PATIENT DISPOSITION:  PACU - hemodynamically stable.   Delay start of Pharmacological VTE agent (>24hrs) due to surgical blood loss or risk of bleeding: yes

## 2018-06-06 NOTE — Plan of Care (Signed)
  Problem: Health Behavior/Discharge Planning: Goal: Ability to manage health-related needs will improve Outcome: Progressing   Problem: Clinical Measurements: Goal: Ability to maintain clinical measurements within normal limits will improve Outcome: Progressing   Problem: Clinical Measurements: Goal: Respiratory complications will improve Outcome: Progressing   Problem: Activity: Goal: Risk for activity intolerance will decrease Outcome: Progressing   Problem: Nutrition: Goal: Adequate nutrition will be maintained Outcome: Progressing   Problem: Elimination: Goal: Will not experience complications related to urinary retention Outcome: Progressing

## 2018-06-06 NOTE — Anesthesia Procedure Notes (Signed)
Spinal  Patient location during procedure: OR Reason for block: at surgeon's request Staffing Anesthesiologist: Miller, Warren Ray, MD Resident/CRNA: Blanton, Shannon M, CRNA Performed: resident/CRNA and anesthesiologist  Preanesthetic Checklist Completed: patient identified, site marked, surgical consent, pre-op evaluation, timeout performed, IV checked, risks and benefits discussed and monitors and equipment checked Spinal Block Patient position: sitting Prep: DuraPrep Patient monitoring: heart rate, continuous pulse ox and blood pressure Approach: right paramedian Location: L4-5 Injection technique: single-shot Needle Needle type: Spinocan and Other  Needle gauge: 22 G Needle length: 9 cm Assessment Sensory level: T6 Additional Notes Expiration date of kit checked and confirmed. Patient tolerated procedure well, without complications. X 1 without success L2-L3 right paramedian.  MD Miller 22g placed at L4-L5 with noted clear CSF return. Loss of motor and sensory on exam post injection.      

## 2018-06-06 NOTE — Anesthesia Procedure Notes (Deleted)
Spinal

## 2018-06-06 NOTE — Anesthesia Preprocedure Evaluation (Signed)
Anesthesia Evaluation  Patient identified by MRN, date of birth, ID band Patient awake    Reviewed: Allergy & Precautions, NPO status , Patient's Chart, lab work & pertinent test results  Airway Mallampati: II  TM Distance: >3 FB Neck ROM: Full    Dental no notable dental hx.    Pulmonary neg pulmonary ROS, sleep apnea , Current Smoker,    Pulmonary exam normal breath sounds clear to auscultation       Cardiovascular hypertension, Pt. on medications + Peripheral Vascular Disease  negative cardio ROS Normal cardiovascular exam Rhythm:Regular Rate:Normal     Neuro/Psych negative neurological ROS  negative psych ROS   GI/Hepatic negative GI ROS, Neg liver ROS,   Endo/Other  negative endocrine ROSHypothyroidism   Renal/GU negative Renal ROS  negative genitourinary   Musculoskeletal negative musculoskeletal ROS (+) Arthritis , Osteoarthritis,    Abdominal   Peds negative pediatric ROS (+)  Hematology negative hematology ROS (+)   Anesthesia Other Findings   Reproductive/Obstetrics negative OB ROS                             Anesthesia Physical Anesthesia Plan  ASA: II  Anesthesia Plan: Spinal and Regional   Post-op Pain Management:  Regional for Post-op pain   Induction: Intravenous  PONV Risk Score and Plan: 0  Airway Management Planned: Simple Face Mask  Additional Equipment:   Intra-op Plan:   Post-operative Plan:   Informed Consent: I have reviewed the patients History and Physical, chart, labs and discussed the procedure including the risks, benefits and alternatives for the proposed anesthesia with the patient or authorized representative who has indicated his/her understanding and acceptance.   Dental advisory given  Plan Discussed with: CRNA  Anesthesia Plan Comments:         Anesthesia Quick Evaluation

## 2018-06-07 LAB — BASIC METABOLIC PANEL
ANION GAP: 8 (ref 5–15)
BUN: 11 mg/dL (ref 8–23)
CALCIUM: 8.8 mg/dL — AB (ref 8.9–10.3)
CO2: 28 mmol/L (ref 22–32)
Chloride: 102 mmol/L (ref 98–111)
Creatinine, Ser: 0.96 mg/dL (ref 0.61–1.24)
GFR calc Af Amer: >60 mL/min (ref >60)
GFR calc non Af Amer: >60 mL/min (ref >60)
Glucose, Bld: 161 mg/dL — ABNORMAL HIGH (ref 70–99)
Potassium: 4.1 mmol/L (ref 3.5–5.1)
Sodium: 138 mmol/L (ref 135–145)

## 2018-06-07 LAB — CBC
HEMATOCRIT: 45.2 % (ref 39.0–52.0)
Hemoglobin: 15.7 g/dL (ref 13.0–17.0)
MCH: 32.5 pg (ref 26.0–34.0)
MCHC: 34.7 g/dL (ref 30.0–36.0)
MCV: 93.6 fL (ref 78.0–100.0)
PLATELETS: 162 10*3/uL (ref 150–400)
RBC: 4.83 MIL/uL (ref 4.22–5.81)
RDW: 12.3 % (ref 11.5–15.5)
WBC: 13.7 10*3/uL — AB (ref 4.0–10.5)

## 2018-06-07 MED ORDER — SODIUM CHLORIDE 0.9 % IV BOLUS
500.0000 mL | Freq: Once | INTRAVENOUS | Status: AC
  Administered 2018-06-07: 500 mL via INTRAVENOUS

## 2018-06-07 NOTE — Care Management Note (Signed)
Case Management Note  Patient Details  Name: [name] MRN: [id number] Date of Birth: 11-06-1956  Subjective/Objective:  62 yo M s/p L TKA                  Action/Plan: Received referral to assist with [facility] and DME   Expected Discharge Date:  06/07/18               Expected Discharge Plan:  Home/Self Care  In-House Referral:     Discharge planning Services  CM Consult  Post Acute Care Choice:    Choice offered to:     DME Arranged:    DME Agency:     HH Arranged:    HH Agency:     Status of Service:  Completed, signed off  If discussed at [employer] of Stay Meetings, dates discussed:    Additional Comments: Met with pt and wife. D/C plan is to return home with the support of his wife. He has a RW and a 3-in-1 BSC. He is going to outpt rehab. Wife reports that everything has been arranged through worker's comp.  Norina Buzzard, RN 06/07/2018, 10:45 AM

## 2018-06-07 NOTE — Evaluation (Signed)
Physical Therapy Evaluation Patient Details Name: [name] MRN: [id number] DOB: 1956-11-06 Today's Date: 06/07/2018   History of Present Illness  s/p L TKR  Clinical Impression  The patient is drowsy but ambul;ated with RW, cues for sequence and safety. Patient plans DC today if meets goals. Pt admitted with above diagnosis. Pt currently with functional limitations due to the deficits listed below (see PT Problem List). Pt will benefit from skilled PT to increase their independence and safety with mobility to allow discharge to the venue listed below.       Follow Up Recommendations Follow surgeon's recommendation for DC plan and follow-up therapies;Outpatient PT    Equipment Recommendations  None recommended by PT    Recommendations for Other Services       Precautions / Restrictions Precautions Precautions: Knee Required Braces or Orthoses: Knee Immobilizer - Left Restrictions Weight Bearing Restrictions: No Other Position/Activity Restrictions: WBAT      Mobility  Bed Mobility Overal bed mobility: Needs Assistance Bed Mobility: Supine to Sit     Supine to sit: Min assist     General bed mobility comments: support the  left leg  Transfers Overall transfer level: Needs assistance Equipment used: Rolling walker (2 wheeled) Transfers: Sit to/from Stand Sit to Stand: Supervision            Ambulation/Gait Ambulation/Gait assistance: Min assist Gait Distance (Feet): 50 Feet Assistive device: Rolling walker (2 wheeled) Gait Pattern/deviations: Step-to pattern;Shuffle;Decreased step length - left;Decreased stance time - left Gait velocity: slow   General Gait Details: cues for  sequence, position inside RW. Patient is ambulating sluggishly.  Stairs            Wheelchair Mobility    Modified Rankin (Stroke Patients Only)       Balance                                             Pertinent Vitals/Pain Pain Assessment:  No/denies pain Pain Score: 8  Pain Location: left knee Pain Descriptors / Indicators: Aching;Sore Pain Intervention(s): Monitored during session;Premedicated before session;Ice applied;Repositioned    Home Living Family/patient expects to be discharged to:: Private residence Living Arrangements: Spouse/significant other;Children Available Help at Discharge: Family Type of Home: House Home Access: Level entry       Home Equipment: Environmental consultantWalker - 2 wheels      Prior Function Level of Independence: Independent               Hand Dominance        Extremity/Trunk Assessment   Upper Extremity Assessment Upper Extremity Assessment: Defer to OT evaluation    Lower Extremity Assessment Lower Extremity Assessment: LLE deficits/detail LLE Deficits / Details: knee flexion to 50, Needs assistance for SLR    Cervical / Trunk Assessment Cervical / Trunk Assessment: Normal  Communication   Communication: No difficulties  Cognition Arousal/Alertness: Lethargic;Suspect due to medications Behavior During Therapy[facility] for tasks assessed/performed Overall Cognitive Status: Within Functional Limits for tasks assessed                                 General Comments: falling asleep during exercises      General Comments      Exercises Total Joint Exercises Ankle Circles/Pumps: AROM;Both;10 reps Quad Sets: AROM;Both;10 reps Heel Slides: AAROM;Left;10 reps;Supine  Hip ABduction/ADduction: AAROM;Left;10 reps;Supine Straight Leg Raises: AAROM;Left;10 reps;Supine Knee Flexion: AAROM;Left;10 reps   Assessment/Plan    PT Assessment Patient needs continued PT services  PT Problem List Decreased strength;Decreased range of motion;Decreased activity tolerance;Decreased knowledge of use of DME;Decreased safety awareness;Decreased mobility;Decreased knowledge of precautions;Pain       PT Treatment Interventions DME instruction;Therapeutic exercise;Gait training;Functional  mobility training;Therapeutic activities;Patient/family education    PT Goals (Current goals can be found in the Care Plan section)  Acute Rehab PT Goals Patient Stated Goal: to go home PT Goal Formulation: With patient/family Time For Goal Achievement: 06/10/18 Potential to Achieve Goals: Good    Frequency 7X/week   Barriers to discharge        Co-evaluation               AM-PAC PT "6 Clicks" Daily Activity  Outcome Measure Difficulty turning over in bed (including adjusting bedclothes, sheets and blankets)?: A Lot Difficulty moving from lying on back to sitting on the side of the bed? : A Lot Difficulty sitting down on and standing up from a chair with arms (e.g., wheelchair, bedside commode, etc,.)?: A Lot Help needed moving to and from a bed to chair (including a wheelchair)?: A Lot Help needed walking in hospital room?: A Lot Help needed climbing 3-5 steps with a railing? : Total 6 Click Score: 11    End of Session Equipment Utilized During Treatment: Gait belt;Left knee immobilizer Activity Tolerance: Patient tolerated treatment well Patient left: in chair;with call bell/phone within reach;with family/visitor present Nurse Communication: Mobility status PT Visit Diagnosis: Unsteadiness on feet (R26.81)    Time: 4401-0272 PT Time Calculation (min) (ACUTE ONLY): 38 min   Charges:   PT Evaluation $PT Eval Low Complexity: 1 Low PT Treatments $Gait Training: 8-22 mins $Therapeutic Exercise: 8-22 mins   PT G CodesBlanchard Kelch PT [phone]   Rada Hay 06/07/2018, 1:18 PM

## 2018-06-07 NOTE — Progress Notes (Signed)
Provided discharge instructions to patient.  Verbalized understanding.  All questions/concerns addressed to patient satisfaction.  Patient discharging to home with all equipment and belongings.

## 2018-06-07 NOTE — Progress Notes (Signed)
Occupational Therapy Evaluation Patient Details Name: [name] MRN: [id number] DOB: 23-Dec-1955 Today's Date: 06/07/2018    History of Present Illness s/p L TKR   Clinical Impression   All OT education completed and pt questions answered. No further OT needs at this time. Will sign off.    Follow Up Recommendations  No OT follow up    Equipment Recommendations  3 in 1 bedside commode    Recommendations for Other Services       Precautions / Restrictions Precautions Precautions: Knee Restrictions Weight Bearing Restrictions: No Other Position/Activity Restrictions: WBAT      Mobility Bed Mobility               General bed mobility comments: NT - up in recliner  Transfers Overall transfer level: Needs assistance Equipment used: Rolling walker (2 wheeled) Transfers: Sit to/from Stand Sit to Stand: Supervision              Balance                                           ADL either performed or assessed with clinical judgement   ADL Overall ADL's : Needs assistance/impaired Eating/Feeding: Independent   Grooming: Wash/dry hands;Wash/dry face;Supervision/safety           Upper Body Dressing : Set up;Sitting   Lower Body Dressing: Minimal assistance;Sit to/from stand   Toilet Transfer: Supervision/safety;Ambulation;BSC;RW   Toileting- Clothing Manipulation and Hygiene: Supervision/safety;Sit to/from Nurse, children's Details (indicate cue type and reason): demonstrated and educated on safe shower transfer; patient declined to practice Functional mobility during ADLs: Supervision/safety;Rolling walker       Vision         Perception     Praxis      Pertinent Vitals/Pain Pain Assessment: 0-10 Pain Score: 8  Pain Location: left knee Pain Descriptors / Indicators: Aching;Sore Pain Intervention(s): Limited activity within patient's tolerance;Monitored during session     Hand Dominance      Extremity/Trunk Assessment Upper Extremity Assessment Upper Extremity Assessment: Overall WFL for tasks assessed   Lower Extremity Assessment Lower Extremity Assessment: Defer to PT evaluation       Communication Communication Communication: No difficulties   Cognition Arousal/Alertness: Awake/alert Behavior During Therapy: WFL for tasks assessed/performed Overall Cognitive Status: Within Functional Limits for tasks assessed                                     General Comments       Exercises     Shoulder Instructions      Home Living Family/patient expects to be discharged to:: Private residence Living Arrangements: Spouse/significant other;Children Available Help at Discharge: Family Type of Home: House             Bathroom Shower/Tub: Walk-in Human resources officer: Standard Bathroom Accessibility: Yes How Accessible: Accessible via walker            Prior Functioning/Environment Level of Independence: Independent                 OT Problem List: Decreased strength;Decreased range of motion;Decreased knowledge of use of DME or AE;Pain      OT Treatment/Interventions:      OT Goals(Current goals can be found in the care plan  section) Acute Rehab OT Goals Patient Stated Goal: to go home OT Goal Formulation: All assessment and education complete, DC therapy  OT Frequency:     Barriers to D/C:            Co-evaluation              AM-PAC PT "6 Clicks" Daily Activity     Outcome Measure Help from another person eating meals?: None Help from another person taking care of personal grooming?: A Little Help from another person toileting, which includes using toliet, bedpan, or urinal?: A Little Help from another person bathing (including washing, rinsing, drying)?: A Little Help from another person to put on and taking off regular upper body clothing?: None Help from another person to put on and taking off regular lower  body clothing?: A Little 6 Click Score: 20   End of Session Equipment Utilized During Treatment: Rolling walker Nurse Communication: Mobility status  Activity Tolerance: Patient tolerated treatment well Patient left: in chair;with call bell/phone within reach  OT Visit Diagnosis: Other abnormalities of gait and mobility (R26.89)                Time: 5621-30861053-1117 OT Time Calculation (min): 24 min Charges:  OT General Charges $OT Visit: 1 Visit OT Evaluation $OT Eval Low Complexity: 1 Low OT Treatments $Self Care/Home Management : 8-22 mins G-Codes:       Brinton Brandel A Rook Maue 06/07/2018, 12:49 PM

## 2018-06-07 NOTE — Progress Notes (Signed)
Physical Therapy Treatment Patient Details Name: [name] MRN: [id number] DOB: [birth year] Today's Date: 06/07/2018    History of Present Illness s/p L TKR    PT Comments    The patient is mobilizing  Better, less  Drowsy this visit. Increased distance. Did ambulate without KI> Patient has not steps. Plans DC today if able to urinate. RN aware. Continue PT tomorrow if not DC'd today.   Follow Up Recommendations  Follow surgeon's recommendation for DC plan and follow-up therapies;Outpatient PT     Equipment Recommendations  None recommended by PT    Recommendations for Other Services       Precautions / Restrictions Precautions Precautions: Knee;Fall Precaution Comments: did not wear KI for ambulation Required Braces or Orthoses: Knee Immobilizer - Left Restrictions Weight Bearing Restrictions: No Other Position/Activity Restrictions: WBAT    Mobility  Bed Mobility Overal bed mobility: Needs Assistance Bed Mobility: Supine to Sit     Supine to sit: Min assist     General bed mobility comments: in the recliner  Transfers Overall transfer level: Needs assistance Equipment used: Rolling walker (2 wheeled) Transfers: Sit to/from Stand Sit to Stand: Supervision         General transfer comment: cues for hand and left leg position  Ambulation/Gait Ambulation/Gait assistance: Min assist Gait Distance (Feet): 90 Feet Assistive device: Rolling walker (2 wheeled) Gait Pattern/deviations: Step-to pattern;Step-through pattern Gait velocity: slow   General Gait Details: cues for  sequence, position inside RW. Patient is ambulating with increased speed. did not use KI this visit   Stairs             Wheelchair Mobility    Modified Rankin (Stroke Patients Only)       Balance                                            Cognition Arousal/Alertness: Awake/alert Behavior During Therapy: WFL for tasks assessed/performed Overall  Cognitive Status: Within Functional Limits for tasks assessed                                 General Comments: morme alert      Exercises    General Comments        Pertinent Vitals/Pain Pain Assessment: No/denies pain Pain Score: 5  Pain Location: left knee Pain Descriptors / Indicators: Aching;Discomfort Pain Intervention(s): Premedicated before session;Monitored during session;Ice applied    Home Living Family/patient expects to be discharged to:: Private residence Living Arrangements: Spouse/significant other;Children Available Help at Discharge: Family Type of Home: House Home Access: Level entry     Home Equipment: Environmental consultant - 2 wheels      Prior Function Level of Independence: Independent          PT Goals (current goals can now be found in the care plan section) Acute Rehab PT Goals Patient Stated Goal: to go home PT Goal Formulation: With patient/family Time For Goal Achievement: 06/10/18 Potential to Achieve Goals: Good Progress towards PT goals: Progressing toward goals    Frequency    7X/week      PT Plan Current plan remains appropriate    Co-evaluation              AM-PAC PT "6 Clicks" Daily Activity  Outcome Measure  Difficulty turning over in bed (including  adjusting bedclothes, sheets and blankets)?: A Lot Difficulty moving from lying on back to sitting on the side of the bed? : A Lot Difficulty sitting down on and standing up from a chair with arms (e.g., wheelchair, bedside commode, etc,.)?: A Lot Help needed moving to and from a bed to chair (including a wheelchair)?: A Lot Help needed walking in hospital room?: A Lot Help needed climbing 3-5 steps with a railing? : A Lot 6 Click Score: 12    End of Session Equipment Utilized During Treatment: Gait belt;Left knee immobilizer Activity Tolerance: Patient tolerated treatment well Patient left: in chair;with call bell/phone within reach;with chair alarm set;with  family/visitor present Nurse Communication: Mobility status PT Visit Diagnosis: Unsteadiness on feet (R26.81)     Time: [id number] PT Time Calculation (min) (ACUTE ONLY): 25 min  Charges:  $Gait Training: 23-37 mins $Therapeutic Exercise: 8-22 mins                    G Codes:          Rada HayHill, Nehemias Sauceda Elizabeth 06/07/2018, 1:38 PM

## 2018-06-07 NOTE — Progress Notes (Signed)
Patient ID: [name], male   DOB: 06-04-1956, 62 y.o.   MRN: [id number]     Subjective:  Patient reports pain as mild to moderate.  Patient in bed and in no acute distress.  Denies any CP or SOB  Objective:   VITALS:   Vitals:   06/06/18 1753 06/06/18 2145 06/07/18 0102 06/07/18 0510  BP: 126/68 128/88 133/85 135/74  Pulse: (!) 54 (!) 57 (!) 59 66  Resp: 14 16 16 16   Temp: 97.6 F (36.4 C)   98.8 F (37.1 C)  TempSrc: Oral   Oral  SpO2: 98% 100% 98% 97%  Weight:      Height:        ABD soft Sensation intact distally Dorsiflexion/Plantar flexion intact Incision: dressing C/D/I and no drainage Dressing changed and wound is good  Lab Results  Component Value Date   WBC 13.7 (H) 06/07/2018   HGB 15.7 06/07/2018   HCT 45.2 06/07/2018   MCV 93.6 06/07/2018   PLT 162 06/07/2018   BMET    Component Value Date/Time   NA 138 06/07/2018 0501   K 4.1 06/07/2018 0501   CL 102 06/07/2018 0501   CO2 28 06/07/2018 0501   GLUCOSE 161 (H) 06/07/2018 0501   BUN 11 06/07/2018 0501   CREATININE 0.96 06/07/2018 0501   CALCIUM 8.8 (L) 06/07/2018 0501   GFRNONAA >60 06/07/2018 0501   GFRAA >60 06/07/2018 0501     Assessment/Plan: 1 Day Post-Op   Active Problems:   S/P knee replacement   Advance diet Up with therapy Plan for DC home if passes PT WBAT Dry dressing PRN    Patient's anticipated LOS is less than 2 midnights, meeting these requirements: - Younger than 6965 - Lives within 1 hour of care - Has a competent adult at home to recover with post-op recover - NO history of  - Chronic pain requiring opiods  - Diabetes  - Coronary Artery Disease  - Heart failure  - Heart attack  - Stroke  - DVT/VTE  - Cardiac arrhythmia  - Respiratory Failure/COPD  - Renal failure  - Anemia  - Advanced Liver disease        Haskel KhanDOUGLAS Madalynne Gutmann 06/07/2018, 8:28 AM   Teryl LucyJoshua Landau, MD Cell [phone]

## 2018-06-09 ENCOUNTER — Encounter (HOSPITAL_COMMUNITY): Payer: Self-pay | Admitting: Orthopedic Surgery

## 2018-06-09 NOTE — Anesthesia Postprocedure Evaluation (Signed)
Anesthesia Post Note  Patient: [name]  Procedure(s) Performed: LEFT TOTAL KNEE ARTHROPLASTY (Left Knee)     Patient location during evaluation: PACU Anesthesia Type: Spinal Level of consciousness: awake and alert Pain management: pain level controlled Vital Signs Assessment: post-procedure vital signs reviewed and stable Respiratory status: spontaneous breathing and respiratory function stable Cardiovascular status: blood pressure returned to baseline and stable Postop Assessment: spinal receding Anesthetic complications: no    Last Vitals:  Vitals:   06/07/18 0832 06/07/18 1328  BP: (!) 148/83 138/69  Pulse: 68 72  Resp: 19 17  Temp:  37.3 C  SpO2: 99% 96%    Last Pain:  Vitals:   06/07/18 1328  TempSrc: Oral  PainSc:                  Kennieth RadFitzgerald, Shuaib Corsino E

## 2018-08-22 ENCOUNTER — Ambulatory Visit: Payer: Self-pay | Admitting: Physician Assistant

## 2018-08-22 NOTE — H&P (Signed)
TOTAL KNEE ADMISSION H&P  Patient is being admitted for right total knee arthroplasty.  Subjective:  Chief Complaint:right knee pain.  HPI: [name], 62 y.o. male, has a history of pain and functional disability in the right knee due to arthritis and has failed non-surgical conservative treatments for greater than 12 weeks to includeNSAID's and/or analgesics, corticosteriod injections, viscosupplementation injections, use of assistive devices and activity modification.  Onset of symptoms was gradual, starting >10 years ago with gradually worsening course since that time. The patient noted no past surgery on the right knee(s).  Patient currently rates pain in the right knee(s) at 10 out of 10 with activity. Patient has night pain, worsening of pain with activity and weight bearing, pain that interferes with activities of daily living, pain with passive range of motion, crepitus and joint swelling.  Patient has evidence of periarticular osteophytes and joint space narrowing by imaging studies.  There is no active infection.  Patient Active Problem List   Diagnosis Date Noted  . S/P knee replacement 06/06/2018   Past Medical History:  Diagnosis Date  . Arthritis   . Family history of adverse reaction to anesthesia    "mother allergic to one of them, don't know what it was"  . Hypertension   . Hypothyroidism   . PAD (peripheral artery disease) (HCC)    had surgery by Dr. Phillips Grout  . Sleep apnea    positive......Marland Kitchenwears mask    Past Surgical History:  Procedure Laterality Date  . APPENDECTOMY    . arthroscopies     bilateral knees   2 & 3 times  . TONSILLECTOMY    . TOTAL KNEE ARTHROPLASTY Left 06/06/2018   Procedure: LEFT TOTAL KNEE ARTHROPLASTY;  Surgeon: Frederico Hamman, MD;  Location: [facility];  Service: Orthopedics;  Laterality: Left;  Marland Kitchen VASCULAR SURGERY     x 2       Current Outpatient Medications  Medication Sig Dispense Refill Last Dose  . acetaminophen (TYLENOL) 500 MG  tablet Take 1,000 mg by mouth every 6 (six) hours as needed for moderate pain or headache.     Marland Kitchen amLODipine (NORVASC) 5 MG tablet Take 5 mg by mouth 2 (two) times daily.  2 06/06/2018 at 0530  . Cholecalciferol (VITAMIN D3) 5000 units TABS Take 5,000 Units by mouth daily.   05/30/2018  . diazepam (VALIUM) 5 MG tablet Take 5 mg by mouth 4 (four) times daily as needed for anxiety.   0 06/05/2018 at Unknown time  . diclofenac (VOLTAREN) 75 MG EC tablet Take 75 mg by mouth daily.     Marland Kitchen gabapentin (NEURONTIN) 300 MG capsule Take 300 mg by mouth at bedtime as needed (for pain).   1 06/05/2018 at Unknown time  . levocetirizine (XYZAL) 5 MG tablet Take 5 mg by mouth daily as needed for allergies.   1 More than a month at Unknown time  . levothyroxine (SYNTHROID, LEVOTHROID) 50 MCG tablet Take 50 mcg by mouth daily before breakfast.   3 05/30/2018  . Multiple Vitamin (MULTIVITAMIN) tablet Take 1 tablet by mouth 2 (two) times daily.    05/30/2018  . oxyCODONE (OXY IR/ROXICODONE) 5 MG immediate release tablet 1 tab po q4-6hrs prn pain, may need 1-2 for the first few weeks (Patient not taking: Reported on 08/22/2018) 60 tablet 0 Completed Course at Unknown time  . propranolol ER (INDERAL LA) 60 MG 24 hr capsule Take 60 mg by mouth 2 (two) times daily.  3 06/06/2018 at  0530  . rosuvastatin (CRESTOR) 20 MG tablet Take 20 mg by mouth 2 (two) times daily.   1 05/30/2018  . tamsulosin (FLOMAX) 0.4 MG CAPS capsule Take 0.4 mg by mouth at bedtime.     Marland Kitchen testosterone cypionate (DEPOTESTOSTERONE CYPIONATE) 200 MG/ML injection Inject 200 mg into the muscle every 21 ( twenty-one) days.  3   . venlafaxine XR (EFFEXOR-XR) 150 MG 24 hr capsule Take 150 mg by mouth daily with breakfast.   3 06/05/2018 at Unknown time  . vitamin E 1000 UNIT capsule Take 1,000 Units by mouth daily.   05/30/2018  . zolpidem (AMBIEN) 10 MG tablet Take 10 mg by mouth at bedtime.   0 06/05/2018 at Unknown time   No current facility-administered medications for  this visit.    No Known Allergies  Social History   Tobacco Use  . Smoking status: Current Some Day Smoker    Packs/day: 0.50    Years: 35.00    Pack years: 17.50    Types: Cigarettes  . Smokeless tobacco: Never Used  Substance Use Topics  . Alcohol use: Yes    Alcohol/week: 7.0 standard drinks    Types: 7 Glasses of wine per week    No family history on file.   Review of Systems  Genitourinary: Positive for urgency.  Musculoskeletal: Positive for joint pain.  Endo/Heme/Allergies: Bruises/bleeds easily.  All other systems reviewed and are negative.   Objective:  Physical Exam  Constitutional: He is oriented to person, place, and time. He appears well-developed and well-nourished. No distress.  HENT:  Head: Normocephalic and atraumatic.  Nose: Nose normal.  Eyes: Pupils are equal, round, and reactive to light. Conjunctivae and EOM are normal.  Neck: Normal range of motion. Neck supple.  Cardiovascular: Normal rate, regular rhythm, normal heart sounds and intact distal pulses.  Respiratory: Effort normal and breath sounds normal. No respiratory distress.  GI: Soft. Bowel sounds are normal. He exhibits no distension. There is no tenderness.  Musculoskeletal:       Right knee: He exhibits swelling. Tenderness found. Medial joint line and lateral joint line tenderness noted.  Lymphadenopathy:    He has no cervical adenopathy.  Neurological: He is alert and oriented to person, place, and time. No cranial nerve deficit.  Skin: Skin is warm and dry. No rash noted. No erythema.  Psychiatric: He has a normal mood and affect. His behavior is normal.    Vital signs in last 24 hours: @VSRANGES @  Labs:   Estimated body mass index is 29.02 kg/m as calculated from the following:   Height as of 06/06/18: 6\' 2"  (1.88 m).   Weight as of 06/06/18: 102.5 kg.   Imaging Review Plain radiographs demonstrate severe degenerative joint disease of the right knee(s). The overall  alignment isneutral. The bone quality appears to be good for age and reported activity level.   Preoperative templating of the joint replacement has been completed, documented, and submitted to the Operating Room personnel in order to optimize intra-operative equipment management.   Anticipated LOS equal to or greater than 2 midnights due to - Age 41 and older with one or more of the following:  - Obesity  - Expected need for hospital services (PT, OT, Nursing) required for safe  discharge  - Anticipated need for postoperative skilled nursing care or inpatient rehab  - Active co-morbidities: None OR   - Unanticipated findings during/Post Surgery: None  - Patient is a high risk of re-admission due to: Non-elective hospital  admission within previous 6 months     Assessment/Plan:  End stage arthritis, right knee   The patient history, physical examination, clinical judgment of the provider and imaging studies are consistent with end stage degenerative joint disease of the right knee(s) and total knee arthroplasty is deemed medically necessary. The treatment options including medical management, injection therapy arthroscopy and arthroplasty were discussed at length. The risks and benefits of total knee arthroplasty were presented and reviewed. The risks due to aseptic loosening, infection, stiffness, patella tracking problems, thromboembolic complications and other imponderables were discussed. The patient acknowledged the explanation, agreed to proceed with the plan and consent was signed. Patient is being admitted for inpatient treatment for surgery, pain control, PT, OT, prophylactic antibiotics, VTE prophylaxis, progressive ambulation and ADL's and discharge planning. The patient is planning to be discharged home with outpt PT

## 2018-08-22 NOTE — H&P (View-Only) (Signed)
TOTAL KNEE ADMISSION H&P  Patient is being admitted for right total knee arthroplasty.  Subjective:  Chief Complaint:right knee pain.  HPI: [name], 61 y.o. male, has a history of pain and functional disability in the right knee due to arthritis and has failed non-surgical conservative treatments for greater than 12 weeks to includeNSAID's and/or analgesics, corticosteriod injections, viscosupplementation injections, use of assistive devices and activity modification.  Onset of symptoms was gradual, starting >10 years ago with gradually worsening course since that time. The patient noted no past surgery on the right knee(s).  Patient currently rates pain in the right knee(s) at 10 out of 10 with activity. Patient has night pain, worsening of pain with activity and weight bearing, pain that interferes with activities of daily living, pain with passive range of motion, crepitus and joint swelling.  Patient has evidence of periarticular osteophytes and joint space narrowing by imaging studies.  There is no active infection.  Patient Active Problem List   Diagnosis Date Noted  . S/P knee replacement 06/06/2018   Past Medical History:  Diagnosis Date  . Arthritis   . Family history of adverse reaction to anesthesia    "mother allergic to one of them, don't know what it was"  . Hypertension   . Hypothyroidism   . PAD (peripheral artery disease) (HCC)    had surgery by Dr. J  Lawson  . Sleep apnea    positive.......wears mask    Past Surgical History:  Procedure Laterality Date  . APPENDECTOMY    . arthroscopies     bilateral knees   2 & 3 times  . TONSILLECTOMY    . TOTAL KNEE ARTHROPLASTY Left 06/06/2018   Procedure: LEFT TOTAL KNEE ARTHROPLASTY;  Surgeon: Caffrey, Daniel, MD;  Location: [facility];  Service: Orthopedics;  Laterality: Left;  . VASCULAR SURGERY     x 2       Current Outpatient Medications  Medication Sig Dispense Refill Last Dose  . acetaminophen (TYLENOL) 500 MG  tablet Take 1,000 mg by mouth every 6 (six) hours as needed for moderate pain or headache.     . amLODipine (NORVASC) 5 MG tablet Take 5 mg by mouth 2 (two) times daily.  2 06/06/2018 at 0530  . Cholecalciferol (VITAMIN D3) 5000 units TABS Take 5,000 Units by mouth daily.   05/30/2018  . diazepam (VALIUM) 5 MG tablet Take 5 mg by mouth 4 (four) times daily as needed for anxiety.   0 06/05/2018 at Unknown time  . diclofenac (VOLTAREN) 75 MG EC tablet Take 75 mg by mouth daily.     . gabapentin (NEURONTIN) 300 MG capsule Take 300 mg by mouth at bedtime as needed (for pain).   1 06/05/2018 at Unknown time  . levocetirizine (XYZAL) 5 MG tablet Take 5 mg by mouth daily as needed for allergies.   1 More than a month at Unknown time  . levothyroxine (SYNTHROID, LEVOTHROID) 50 MCG tablet Take 50 mcg by mouth daily before breakfast.   3 05/30/2018  . Multiple Vitamin (MULTIVITAMIN) tablet Take 1 tablet by mouth 2 (two) times daily.    05/30/2018  . oxyCODONE (OXY IR/ROXICODONE) 5 MG immediate release tablet 1 tab po q4-6hrs prn pain, may need 1-2 for the first few weeks (Patient not taking: Reported on 08/22/2018) 60 tablet 0 Completed Course at Unknown time  . propranolol ER (INDERAL LA) 60 MG 24 hr capsule Take 60 mg by mouth 2 (two) times daily.  3 06/06/2018 at   0530  . rosuvastatin (CRESTOR) 20 MG tablet Take 20 mg by mouth 2 (two) times daily.   1 05/30/2018  . tamsulosin (FLOMAX) 0.4 MG CAPS capsule Take 0.4 mg by mouth at bedtime.     . testosterone cypionate (DEPOTESTOSTERONE CYPIONATE) 200 MG/ML injection Inject 200 mg into the muscle every 21 ( twenty-one) days.  3   . venlafaxine XR (EFFEXOR-XR) 150 MG 24 hr capsule Take 150 mg by mouth daily with breakfast.   3 06/05/2018 at Unknown time  . vitamin E 1000 UNIT capsule Take 1,000 Units by mouth daily.   05/30/2018  . zolpidem (AMBIEN) 10 MG tablet Take 10 mg by mouth at bedtime.   0 06/05/2018 at Unknown time   No current facility-administered medications for  this visit.    No Known Allergies  Social History   Tobacco Use  . Smoking status: Current Some Day Smoker    Packs/day: 0.50    Years: 35.00    Pack years: 17.50    Types: Cigarettes  . Smokeless tobacco: Never Used  Substance Use Topics  . Alcohol use: Yes    Alcohol/week: 7.0 standard drinks    Types: 7 Glasses of wine per week    No family history on file.   Review of Systems  Genitourinary: Positive for urgency.  Musculoskeletal: Positive for joint pain.  Endo/Heme/Allergies: Bruises/bleeds easily.  All other systems reviewed and are negative.   Objective:  Physical Exam  Constitutional: He is oriented to person, place, and time. He appears well-developed and well-nourished. No distress.  HENT:  Head: Normocephalic and atraumatic.  Nose: Nose normal.  Eyes: Pupils are equal, round, and reactive to light. Conjunctivae and EOM are normal.  Neck: Normal range of motion. Neck supple.  Cardiovascular: Normal rate, regular rhythm, normal heart sounds and intact distal pulses.  Respiratory: Effort normal and breath sounds normal. No respiratory distress.  GI: Soft. Bowel sounds are normal. He exhibits no distension. There is no tenderness.  Musculoskeletal:       Right knee: He exhibits swelling. Tenderness found. Medial joint line and lateral joint line tenderness noted.  Lymphadenopathy:    He has no cervical adenopathy.  Neurological: He is alert and oriented to person, place, and time. No cranial nerve deficit.  Skin: Skin is warm and dry. No rash noted. No erythema.  Psychiatric: He has a normal mood and affect. His behavior is normal.    Vital signs in last 24 hours: @VSRANGES@  Labs:   Estimated body mass index is 29.02 kg/m as calculated from the following:   Height as of 06/06/18: 6' 2" (1.88 m).   Weight as of 06/06/18: 102.5 kg.   Imaging Review Plain radiographs demonstrate severe degenerative joint disease of the right knee(s). The overall  alignment isneutral. The bone quality appears to be good for age and reported activity level.   Preoperative templating of the joint replacement has been completed, documented, and submitted to the Operating Room personnel in order to optimize intra-operative equipment management.   Anticipated LOS equal to or greater than 2 midnights due to - Age 65 and older with one or more of the following:  - Obesity  - Expected need for hospital services (PT, OT, Nursing) required for safe  discharge  - Anticipated need for postoperative skilled nursing care or inpatient rehab  - Active co-morbidities: None OR   - Unanticipated findings during/Post Surgery: None  - Patient is a high risk of re-admission due to: Non-elective hospital   admission within previous 6 months     Assessment/Plan:  End stage arthritis, right knee   The patient history, physical examination, clinical judgment of the provider and imaging studies are consistent with end stage degenerative joint disease of the right knee(s) and total knee arthroplasty is deemed medically necessary. The treatment options including medical management, injection therapy arthroscopy and arthroplasty were discussed at length. The risks and benefits of total knee arthroplasty were presented and reviewed. The risks due to aseptic loosening, infection, stiffness, patella tracking problems, thromboembolic complications and other imponderables were discussed. The patient acknowledged the explanation, agreed to proceed with the plan and consent was signed. Patient is being admitted for inpatient treatment for surgery, pain control, PT, OT, prophylactic antibiotics, VTE prophylaxis, progressive ambulation and ADL's and discharge planning. The patient is planning to be discharged home with outpt PT  

## 2018-08-28 NOTE — Patient Instructions (Signed)
[name]  12-28-1955    Your procedure is scheduled on:   09-05-2018   Report to [facility] Main  Entrance,  Report to admitting at  7:45 AM    Call this number if you have problems the morning of surgery [phone]     Remember: Do not eat food or drink liquids :After Midnight.                   BRUSH YOUR TEETH MORNING OF SURGERY AND RINSE YOUR MOUTH OUT, NO CHEWING GUM CANDY OR MINTS.     Take these medicines the morning of surgery with A SIP OF WATER:   Venlafaxine(effexor), Rousvastatin (crestor),  Propranolol,  Amlodipine,  Levothyroxine                                You may not have any metal on your body including hair pins and              piercings               Do not wear jewelry, make-up, lotions, powders or perfumes, deodorant                      Men may shave face and neck.   Do not bring valuables to the hospital. [facility] IS NOT             RESPONSIBLE   FOR VALUABLES.  Contacts, dentures or bridgework may not be worn into surgery.  Leave suitcase in the car. After surgery it may be brought to your room.                Special Instruction:   BRING CPAP MASK AND TUBING DAY OF SURGERY              Please read over the following fact sheets you were given: _____________________________________________________________________             [facility] - Preparing for Surgery Before surgery, you can play an important role.  Because skin is not sterile, your skin needs to be as free of germs as possible.  You can reduce the number of germs on your skin by washing with CHG (chlorahexidine gluconate) soap before surgery.  CHG is an antiseptic cleaner which kills germs and bonds with the skin to continue killing germs even after washing. Please DO NOT use if you have an allergy to CHG or antibacterial soaps.  If your skin becomes reddened/irritated stop using the CHG and inform your nurse when you arrive at Short Stay. Do not  shave (including legs and underarms) for at least 48 hours prior to the first CHG shower.  You may shave your face/neck. Please follow these instructions carefully:  1.  Shower with CHG Soap the night before surgery and the  morning of Surgery.  2.  If you choose to wash your hair, wash your hair first as usual with your  normal  shampoo.  3.  After you shampoo, rinse your hair and body thoroughly to remove the  shampoo.                            4.  Use CHG as you would any other liquid soap.  You can  apply chg directly  to the skin and wash                       Gently with a scrungie or clean washcloth.  5.  Apply the CHG Soap to your body ONLY FROM THE NECK DOWN.   Do not use on face/ open                           Wound or open sores. Avoid contact with eyes, ears mouth and genitals (private parts).                       Wash face,  Genitals (private parts) with your normal soap.             6.  Wash thoroughly, paying special attention to the area where your surgery  will be performed.  7.  Thoroughly rinse your body with warm water from the neck down.  8.  DO NOT shower/wash with your normal soap after using and rinsing off  the CHG Soap.             9.  Pat yourself dry with a clean towel.            10.  Wear clean pajamas.            11.  Place clean sheets on your bed the night of your first shower and do not  sleep with pets. Day of Surgery : Do not apply any lotions/deodorants the morning of surgery.  Please wear clean clothes to the hospital/surgery center.  FAILURE TO FOLLOW THESE INSTRUCTIONS MAY RESULT IN THE CANCELLATION OF YOUR SURGERY PATIENT SIGNATURE_________________________________  NURSE SIGNATURE__________________________________       ___________________________________________________________Incentive Spirometer  An incentive spirometer is a tool that can help keep your lungs clear and active. This tool measures how well you are filling your lungs with each  breath. Taking long deep breaths may help reverse or decrease the chance of developing breathing (pulmonary) problems (especially infection) following:  A long period of time when you are unable to move or be active. BEFORE THE PROCEDURE   If the spirometer includes an indicator to show your best effort, your nurse or respiratory therapist will set it to a desired goal.  If possible, sit up straight or lean slightly forward. Try not to slouch.  Hold the incentive spirometer in an upright position. INSTRUCTIONS FOR USE  1. Sit on the edge of your bed if possible, or sit up as far as you can in bed or on a chair. 2. Hold the incentive spirometer in an upright position. 3. Breathe out normally. 4. Place the mouthpiece in your mouth and seal your lips tightly around it. 5. Breathe in slowly and as deeply as possible, raising the piston or the ball toward the top of the column. 6. Hold your breath for 3-5 seconds or for as long as possible. Allow the piston or ball to fall to the bottom of the column. 7. Remove the mouthpiece from your mouth and breathe out normally. 8. Rest for a few seconds and repeat Steps 1 through 7 at least 10 times every 1-2 hours when you are awake. Take your time and take a few normal breaths between deep breaths. 9. The spirometer may include an indicator to show your best effort. Use the indicator as a goal to work toward during each repetition.  10. After each set of 10 deep breaths, practice coughing to be sure your lungs are clear. If you have an incision (the cut made at the time of surgery), support your incision when coughing by placing a pillow or rolled up towels firmly against it. Once you are able to get out of bed, walk around indoors and cough well. You may stop using the incentive spirometer when instructed by your caregiver.  RISKS AND COMPLICATIONS  Take your time so you do not get dizzy or light-headed.  If you are in pain, you may need to take or ask  for pain medication before doing incentive spirometry. It is harder to take a deep breath if you are having pain. AFTER USE  Rest and breathe slowly and easily.  It can be helpful to keep track of a log of your progress. Your caregiver can provide you with a simple table to help with this. If you are using the spirometer at home, follow these instructions: SEEK MEDICAL CARE IF:   You are having difficultly using the spirometer.  You have trouble using the spirometer as often as instructed.  Your pain medication is not giving enough relief while using the spirometer.  You develop fever of 100.5 F (38.1 C) or higher. SEEK IMMEDIATE MEDICAL CARE IF:   You cough up bloody sputum that had not been present before.  You develop fever of 102 F (38.9 C) or greater.  You develop worsening pain at or near the incision site. MAKE SURE YOU:   Understand these instructions.  Will watch your condition.  Will get help right away if you are not doing well or get worse. Document Released: 03/25/2007 Document Revised: 02/04/2012 Document Reviewed: 05/26/2007 [facility] Patient Information 2014 [place], [place].   ________________________________________________________________________

## 2018-08-29 ENCOUNTER — Encounter (HOSPITAL_COMMUNITY)
Admission: RE | Admit: 2018-08-29 | Discharge: 2018-08-29 | Disposition: A | Discharge status: Home / Self Care | Source: Ambulatory Visit | Attending: Orthopedic Surgery | Admitting: Orthopedic Surgery

## 2018-08-29 ENCOUNTER — Other Ambulatory Visit: Payer: Self-pay

## 2018-08-29 ENCOUNTER — Encounter (HOSPITAL_COMMUNITY): Payer: Self-pay

## 2018-08-29 DIAGNOSIS — Z01818 Encounter for other preprocedural examination: Secondary | ICD-10-CM | POA: Insufficient documentation

## 2018-08-29 HISTORY — DX: Testicular hypofunction: E29.1

## 2018-08-29 HISTORY — DX: Thrombocytopenia, unspecified: D69.6

## 2018-08-29 HISTORY — DX: Obstructive sleep apnea (adult) (pediatric): G47.33

## 2018-08-29 HISTORY — DX: Dependence on other enabling machines and devices: Z99.89

## 2018-08-29 LAB — CBC WITH DIFFERENTIAL/PLATELET
BASOS ABS: 0 10*3/uL (ref 0.0–0.1)
Basophils Relative: 0 %
Eosinophils Absolute: 0.3 10*3/uL (ref 0.0–0.7)
Eosinophils Relative: 3 %
HCT: 42.3 % (ref 39.0–52.0)
HEMOGLOBIN: 14.5 g/dL (ref 13.0–17.0)
LYMPHS ABS: 3.6 10*3/uL (ref 0.7–4.0)
LYMPHS PCT: 36 %
MCH: 32.5 pg (ref 26.0–34.0)
MCHC: 34.3 g/dL (ref 30.0–36.0)
MCV: 94.8 fL (ref 78.0–100.0)
Monocytes Absolute: 0.8 10*3/uL (ref 0.1–1.0)
Monocytes Relative: 8 %
NEUTROS ABS: 5.4 10*3/uL (ref 1.7–7.7)
Neutrophils Relative %: 53 %
Platelets: 215 10*3/uL (ref 150–400)
RBC: 4.46 MIL/uL (ref 4.22–5.81)
RDW: 13.7 % (ref 11.5–15.5)
WBC: 10 10*3/uL (ref 4.0–10.5)

## 2018-08-29 LAB — COMPREHENSIVE METABOLIC PANEL
ALK PHOS: 59 U/L (ref 38–126)
ALT: 33 U/L (ref 0–44)
AST: 35 U/L (ref 15–41)
Albumin: 4.1 g/dL (ref 3.5–5.0)
Anion gap: 7 (ref 5–15)
BUN: 12 mg/dL (ref 8–23)
CALCIUM: 9.6 mg/dL (ref 8.9–10.3)
CHLORIDE: 110 mmol/L (ref 98–111)
CO2: 28 mmol/L (ref 22–32)
CREATININE: 1.04 mg/dL (ref 0.61–1.24)
Glucose, Bld: 90 mg/dL (ref 70–99)
Potassium: 5.2 mmol/L — ABNORMAL HIGH (ref 3.5–5.1)
Sodium: 145 mmol/L (ref 135–145)
Total Bilirubin: 0.6 mg/dL (ref 0.3–1.2)
Total Protein: 6.8 g/dL (ref 6.5–8.1)

## 2018-08-29 LAB — SURGICAL PCR SCREEN
MRSA, PCR: NEGATIVE
STAPHYLOCOCCUS AUREUS: NEGATIVE

## 2018-08-29 NOTE — Progress Notes (Addendum)
While pt here today for PAT appointment, stated he had EKG done in July 2019 at his pcp, dr Debroah Loop.  He called his pcp office and requested his ekg to be faxed to [phone] , pt spoke w/ shannon.  ADDENDUM:  Received via fax pt 12 lead EKG dated 04-2018, placed in chart.

## 2018-09-02 ENCOUNTER — Encounter (HOSPITAL_COMMUNITY): Payer: Self-pay

## 2018-09-04 MED ORDER — TRANEXAMIC ACID 1000 MG/10ML IV SOLN
2000.0000 mg | INTRAVENOUS | Status: DC
  Filled 2018-09-04: qty 20

## 2018-09-04 MED ORDER — BUPIVACAINE LIPOSOME 1.3 % IJ SUSP
20.0000 mL | INTRAMUSCULAR | Status: DC
  Filled 2018-09-04: qty 20

## 2018-09-05 ENCOUNTER — Encounter (HOSPITAL_COMMUNITY)
Admission: RE | Disposition: A | Discharge status: Home / Self Care | Payer: Self-pay | Source: Ambulatory Visit | Attending: Orthopedic Surgery

## 2018-09-05 ENCOUNTER — Encounter (HOSPITAL_COMMUNITY): Payer: Self-pay | Admitting: General Practice

## 2018-09-05 ENCOUNTER — Inpatient Hospital Stay (HOSPITAL_COMMUNITY): Admitting: Anesthesiology

## 2018-09-05 ENCOUNTER — Inpatient Hospital Stay (HOSPITAL_COMMUNITY)
Admission: RE | Admit: 2018-09-05 | Discharge: 2018-09-06 | DRG: 470 | Disposition: A | Discharge status: Home / Self Care | Source: Ambulatory Visit | Attending: Orthopedic Surgery | Admitting: Orthopedic Surgery

## 2018-09-05 ENCOUNTER — Other Ambulatory Visit: Payer: Self-pay

## 2018-09-05 DIAGNOSIS — E039 Hypothyroidism, unspecified: Secondary | ICD-10-CM | POA: Diagnosis present

## 2018-09-05 DIAGNOSIS — Z96659 Presence of unspecified artificial knee joint: Secondary | ICD-10-CM

## 2018-09-05 DIAGNOSIS — G4733 Obstructive sleep apnea (adult) (pediatric): Secondary | ICD-10-CM | POA: Diagnosis present

## 2018-09-05 DIAGNOSIS — Z79899 Other long term (current) drug therapy: Secondary | ICD-10-CM

## 2018-09-05 DIAGNOSIS — M1711 Unilateral primary osteoarthritis, right knee: Secondary | ICD-10-CM | POA: Diagnosis present

## 2018-09-05 DIAGNOSIS — I10 Essential (primary) hypertension: Secondary | ICD-10-CM | POA: Diagnosis present

## 2018-09-05 DIAGNOSIS — Z96652 Presence of left artificial knee joint: Secondary | ICD-10-CM | POA: Diagnosis present

## 2018-09-05 DIAGNOSIS — I739 Peripheral vascular disease, unspecified: Secondary | ICD-10-CM | POA: Diagnosis present

## 2018-09-05 DIAGNOSIS — Z9989 Dependence on other enabling machines and devices: Secondary | ICD-10-CM

## 2018-09-05 DIAGNOSIS — F17210 Nicotine dependence, cigarettes, uncomplicated: Secondary | ICD-10-CM | POA: Diagnosis present

## 2018-09-05 HISTORY — PX: TOTAL KNEE ARTHROPLASTY: SHX125

## 2018-09-05 SURGERY — ARTHROPLASTY, KNEE, TOTAL
Anesthesia: Monitor Anesthesia Care | Site: Knee | Laterality: Right

## 2018-09-05 MED ORDER — ACETAMINOPHEN 325 MG PO TABS: 325.0000 mg | ORAL_TABLET | Freq: Four times a day (QID) | ORAL | Status: DC | PRN

## 2018-09-05 MED ORDER — ONDANSETRON HCL 4 MG/2ML IJ SOLN: 4.0000 mg | Freq: Four times a day (QID) | INTRAMUSCULAR | Status: DC | PRN

## 2018-09-05 MED ORDER — OXYCODONE HCL 5 MG PO TABS: 5.0000 mg | ORAL_TABLET | ORAL | Status: DC | PRN

## 2018-09-05 MED ORDER — DOCUSATE SODIUM 100 MG PO CAPS
100.0000 mg | ORAL_CAPSULE | Freq: Two times a day (BID) | ORAL | Status: DC
  Administered 2018-09-05 – 2018-09-06 (×2): 100 mg via ORAL
  Filled 2018-09-05 (×2): qty 1

## 2018-09-05 MED ORDER — ROPIVACAINE HCL 7.5 MG/ML IJ SOLN
INTRAMUSCULAR | Status: DC | PRN
  Administered 2018-09-05: 20 mL via PERINEURAL

## 2018-09-05 MED ORDER — SODIUM CHLORIDE 0.9 % IV SOLN: INTRAVENOUS | Status: DC

## 2018-09-05 MED ORDER — POLYETHYLENE GLYCOL 3350 17 G PO PACK: 17.0000 g | PACK | Freq: Every day | ORAL | Status: DC | PRN

## 2018-09-05 MED ORDER — PROPOFOL 10 MG/ML IV BOLUS
INTRAVENOUS | Status: AC
  Filled 2018-09-05: qty 40

## 2018-09-05 MED ORDER — LEVOTHYROXINE SODIUM 50 MCG PO TABS
50.0000 ug | ORAL_TABLET | Freq: Every day | ORAL | Status: DC
  Administered 2018-09-06: 50 ug via ORAL
  Filled 2018-09-05: qty 1

## 2018-09-05 MED ORDER — PROPOFOL 500 MG/50ML IV EMUL
INTRAVENOUS | Status: DC | PRN
  Administered 2018-09-05: 30 mg via INTRAVENOUS

## 2018-09-05 MED ORDER — SODIUM CHLORIDE 0.9 % IV SOLN
INTRAVENOUS | Status: DC
  Administered 2018-09-05: 17:00:00 via INTRAVENOUS

## 2018-09-05 MED ORDER — OXYCODONE HCL 5 MG/5ML PO SOLN: 5.0000 mg | Freq: Once | ORAL | Status: DC | PRN

## 2018-09-05 MED ORDER — ROSUVASTATIN CALCIUM 20 MG PO TABS
20.0000 mg | ORAL_TABLET | Freq: Two times a day (BID) | ORAL | Status: DC
  Administered 2018-09-05 – 2018-09-06 (×2): 20 mg via ORAL
  Filled 2018-09-05 (×2): qty 1

## 2018-09-05 MED ORDER — AMLODIPINE BESYLATE 5 MG PO TABS
5.0000 mg | ORAL_TABLET | Freq: Two times a day (BID) | ORAL | Status: DC
  Administered 2018-09-05 – 2018-09-06 (×2): 5 mg via ORAL
  Filled 2018-09-05 (×2): qty 1

## 2018-09-05 MED ORDER — BUPIVACAINE LIPOSOME 1.3 % IJ SUSP
INTRAMUSCULAR | Status: DC | PRN
  Administered 2018-09-05: 20 mL

## 2018-09-05 MED ORDER — LEVOCETIRIZINE DIHYDROCHLORIDE 5 MG PO TABS: 5.0000 mg | ORAL_TABLET | Freq: Every day | ORAL | Status: DC | PRN

## 2018-09-05 MED ORDER — ACETAMINOPHEN 500 MG PO TABS: 1000.0000 mg | ORAL_TABLET | Freq: Once | ORAL | Status: DC | PRN

## 2018-09-05 MED ORDER — LORATADINE 10 MG PO TABS: 10.0000 mg | ORAL_TABLET | Freq: Every day | ORAL | Status: DC | PRN

## 2018-09-05 MED ORDER — DIAZEPAM 5 MG PO TABS
5.0000 mg | ORAL_TABLET | Freq: Four times a day (QID) | ORAL | Status: DC | PRN
  Administered 2018-09-05: 5 mg via ORAL
  Filled 2018-09-05: qty 1

## 2018-09-05 MED ORDER — FENTANYL CITRATE (PF) 100 MCG/2ML IJ SOLN: 25.0000 ug | INTRAMUSCULAR | Status: DC | PRN

## 2018-09-05 MED ORDER — METOCLOPRAMIDE HCL 5 MG PO TABS: 5.0000 mg | ORAL_TABLET | Freq: Three times a day (TID) | ORAL | Status: DC | PRN

## 2018-09-05 MED ORDER — ACETAMINOPHEN 10 MG/ML IV SOLN: 1000.0000 mg | Freq: Once | INTRAVENOUS | Status: DC | PRN

## 2018-09-05 MED ORDER — OXYCODONE HCL 5 MG PO TABS: ORAL_TABLET | ORAL | 0 refills | Status: AC

## 2018-09-05 MED ORDER — TRANEXAMIC ACID-NACL 1000-0.7 MG/100ML-% IV SOLN
1000.0000 mg | Freq: Once | INTRAVENOUS | Status: AC
  Administered 2018-09-05: 1000 mg via INTRAVENOUS
  Filled 2018-09-05: qty 100

## 2018-09-05 MED ORDER — TAMSULOSIN HCL 0.4 MG PO CAPS
0.4000 mg | ORAL_CAPSULE | Freq: Every day | ORAL | Status: DC
  Administered 2018-09-05: 0.4 mg via ORAL
  Filled 2018-09-05: qty 1

## 2018-09-05 MED ORDER — LACTATED RINGERS IV SOLN
INTRAVENOUS | Status: DC | PRN
  Administered 2018-09-05 (×3): via INTRAVENOUS

## 2018-09-05 MED ORDER — MENTHOL 3 MG MT LOZG: 1.0000 | LOZENGE | OROMUCOSAL | Status: DC | PRN

## 2018-09-05 MED ORDER — FENTANYL CITRATE (PF) 100 MCG/2ML IJ SOLN
INTRAMUSCULAR | Status: DC | PRN
  Administered 2018-09-05: 100 ug via INTRAVENOUS

## 2018-09-05 MED ORDER — TRANEXAMIC ACID 1000 MG/10ML IV SOLN
INTRAVENOUS | Status: DC | PRN
  Administered 2018-09-05: 2000 mg via TOPICAL

## 2018-09-05 MED ORDER — PROPOFOL 10 MG/ML IV BOLUS
INTRAVENOUS | Status: AC
  Filled 2018-09-05: qty 20

## 2018-09-05 MED ORDER — TRANEXAMIC ACID-NACL 1000-0.7 MG/100ML-% IV SOLN
1000.0000 mg | INTRAVENOUS | Status: AC
  Administered 2018-09-05: 1000 mg via INTRAVENOUS
  Filled 2018-09-05: qty 100

## 2018-09-05 MED ORDER — FENTANYL CITRATE (PF) 100 MCG/2ML IJ SOLN
INTRAMUSCULAR | Status: AC
  Filled 2018-09-05: qty 2

## 2018-09-05 MED ORDER — ASPIRIN 81 MG PO CHEW
81.0000 mg | CHEWABLE_TABLET | Freq: Two times a day (BID) | ORAL | Status: DC
  Administered 2018-09-05 – 2018-09-06 (×2): 81 mg via ORAL
  Filled 2018-09-05 (×2): qty 1

## 2018-09-05 MED ORDER — SODIUM CHLORIDE 0.9 % IR SOLN
Status: DC | PRN
  Administered 2018-09-05: 1000 mL

## 2018-09-05 MED ORDER — FENTANYL CITRATE (PF) 100 MCG/2ML IJ SOLN
50.0000 ug | INTRAMUSCULAR | Status: DC
  Administered 2018-09-05: 50 ug via INTRAVENOUS
  Filled 2018-09-05: qty 2

## 2018-09-05 MED ORDER — ONDANSETRON HCL 4 MG PO TABS: 4.0000 mg | ORAL_TABLET | Freq: Four times a day (QID) | ORAL | Status: DC | PRN

## 2018-09-05 MED ORDER — BUPIVACAINE IN DEXTROSE 0.75-8.25 % IT SOLN
INTRATHECAL | Status: DC | PRN
  Administered 2018-09-05: 2 mL via INTRATHECAL

## 2018-09-05 MED ORDER — PHENOL 1.4 % MT LIQD: 1.0000 | OROMUCOSAL | Status: DC | PRN

## 2018-09-05 MED ORDER — BISACODYL 10 MG RE SUPP: 10.0000 mg | Freq: Every day | RECTAL | Status: DC | PRN

## 2018-09-05 MED ORDER — PROPOFOL 10 MG/ML IV BOLUS
INTRAVENOUS | Status: AC
  Filled 2018-09-05: qty 60

## 2018-09-05 MED ORDER — VENLAFAXINE HCL ER 150 MG PO CP24
150.0000 mg | ORAL_CAPSULE | Freq: Every day | ORAL | Status: DC
  Administered 2018-09-06: 150 mg via ORAL
  Filled 2018-09-05: qty 1

## 2018-09-05 MED ORDER — ZOLPIDEM TARTRATE 10 MG PO TABS
10.0000 mg | ORAL_TABLET | Freq: Every day | ORAL | Status: DC
  Administered 2018-09-05: 10 mg via ORAL
  Filled 2018-09-05: qty 1

## 2018-09-05 MED ORDER — PROPOFOL 500 MG/50ML IV EMUL
INTRAVENOUS | Status: DC | PRN
  Administered 2018-09-05: 75 ug/kg/min via INTRAVENOUS

## 2018-09-05 MED ORDER — OXYCODONE HCL 5 MG PO TABS
10.0000 mg | ORAL_TABLET | ORAL | Status: DC | PRN
  Administered 2018-09-05 – 2018-09-06 (×3): 15 mg via ORAL
  Filled 2018-09-05 (×3): qty 3

## 2018-09-05 MED ORDER — LACTATED RINGERS IV SOLN
Freq: Once | INTRAVENOUS | Status: AC
  Administered 2018-09-05: 09:00:00 via INTRAVENOUS

## 2018-09-05 MED ORDER — CHLORHEXIDINE GLUCONATE 4 % EX LIQD: 60.0000 mL | Freq: Once | CUTANEOUS | Status: DC

## 2018-09-05 MED ORDER — ASPIRIN EC 81 MG PO TBEC: 81.0000 mg | DELAYED_RELEASE_TABLET | Freq: Two times a day (BID) | ORAL | 0 refills | Status: AC

## 2018-09-05 MED ORDER — OXYCODONE HCL 5 MG PO TABS: 5.0000 mg | ORAL_TABLET | Freq: Once | ORAL | Status: DC | PRN

## 2018-09-05 MED ORDER — SODIUM CHLORIDE 0.9 % IJ SOLN
INTRAMUSCULAR | Status: AC
  Filled 2018-09-05: qty 50

## 2018-09-05 MED ORDER — CEFAZOLIN SODIUM-DEXTROSE 2-4 GM/100ML-% IV SOLN
2.0000 g | INTRAVENOUS | Status: AC
  Administered 2018-09-05: 2 g via INTRAVENOUS
  Filled 2018-09-05: qty 100

## 2018-09-05 MED ORDER — BUPIVACAINE-EPINEPHRINE 0.25% -1:200000 IJ SOLN
INTRAMUSCULAR | Status: DC | PRN
  Administered 2018-09-05: 50 mL

## 2018-09-05 MED ORDER — ACETAMINOPHEN 160 MG/5ML PO SOLN: 1000.0000 mg | Freq: Once | ORAL | Status: DC | PRN

## 2018-09-05 MED ORDER — EPHEDRINE SULFATE 50 MG/ML IJ SOLN
INTRAMUSCULAR | Status: DC | PRN
  Administered 2018-09-05: 15 mg via INTRAVENOUS
  Administered 2018-09-05: 10 mg via INTRAVENOUS

## 2018-09-05 MED ORDER — CEFAZOLIN SODIUM-DEXTROSE 1-4 GM/50ML-% IV SOLN
1.0000 g | Freq: Four times a day (QID) | INTRAVENOUS | Status: AC
  Administered 2018-09-05 (×2): 1 g via INTRAVENOUS
  Filled 2018-09-05 (×2): qty 50

## 2018-09-05 MED ORDER — PROPRANOLOL HCL ER 60 MG PO CP24
60.0000 mg | ORAL_CAPSULE | Freq: Two times a day (BID) | ORAL | Status: DC
  Administered 2018-09-05 – 2018-09-06 (×2): 60 mg via ORAL
  Filled 2018-09-05 (×2): qty 1

## 2018-09-05 MED ORDER — GABAPENTIN 300 MG PO CAPS: 300.0000 mg | ORAL_CAPSULE | Freq: Every evening | ORAL | Status: DC | PRN

## 2018-09-05 MED ORDER — ACETAMINOPHEN 500 MG PO TABS
1000.0000 mg | ORAL_TABLET | Freq: Four times a day (QID) | ORAL | Status: DC
  Administered 2018-09-05 – 2018-09-06 (×3): 1000 mg via ORAL
  Filled 2018-09-05 (×3): qty 2

## 2018-09-05 MED ORDER — BUPIVACAINE-EPINEPHRINE 0.5% -1:200000 IJ SOLN
INTRAMUSCULAR | Status: AC
  Filled 2018-09-05: qty 1

## 2018-09-05 MED ORDER — METOCLOPRAMIDE HCL 5 MG/ML IJ SOLN: 5.0000 mg | Freq: Three times a day (TID) | INTRAMUSCULAR | Status: DC | PRN

## 2018-09-05 MED ORDER — FLEET ENEMA 7-19 GM/118ML RE ENEM: 1.0000 | ENEMA | Freq: Once | RECTAL | Status: DC | PRN

## 2018-09-05 MED ORDER — MIDAZOLAM HCL 2 MG/2ML IJ SOLN
1.0000 mg | INTRAMUSCULAR | Status: DC
  Administered 2018-09-05: 1 mg via INTRAVENOUS
  Filled 2018-09-05: qty 2

## 2018-09-05 MED ORDER — HYDROMORPHONE HCL 1 MG/ML IJ SOLN
0.5000 mg | INTRAMUSCULAR | Status: DC | PRN
  Administered 2018-09-05: 1 mg via INTRAVENOUS
  Filled 2018-09-05: qty 1

## 2018-09-05 SURGICAL SUPPLY — 63 items
BAG DECANTER FOR FLEXI CONT (MISCELLANEOUS) ×3 IMPLANT
BAG SPEC THK2 15X12 ZIP CLS (MISCELLANEOUS) ×1
BAG ZIPLOCK 12X15 (MISCELLANEOUS) ×3 IMPLANT
BANDAGE ACE 4X5 VEL STRL LF (GAUZE/BANDAGES/DRESSINGS) ×2 IMPLANT
BANDAGE ACE 6X5 VEL STRL LF (GAUZE/BANDAGES/DRESSINGS) ×3 IMPLANT
BLADE SAGITTAL 25.0X1.19X90 (BLADE) ×2 IMPLANT
BLADE SAGITTAL 25.0X1.19X90MM (BLADE) ×1
BLADE SAW SAG 90X13X1.27 (BLADE) ×3 IMPLANT
BLADE SURG 15 STRL LF DISP TIS (BLADE) ×1 IMPLANT
BLADE SURG 15 STRL SS (BLADE) ×3
BOWL SMART MIX CTS (DISPOSABLE) ×3 IMPLANT
CEMENT HV SMART SET (Cement) ×4 IMPLANT
CEMENT TIBIA MBT SIZE 4 (Knees) ×1 IMPLANT
COVER SURGICAL LIGHT HANDLE (MISCELLANEOUS) ×3 IMPLANT
COVER WAND RF STERILE (DRAPES) IMPLANT
CUFF TOURN SGL QUICK 34 (TOURNIQUET CUFF) ×3
CUFF TRNQT CYL 34X4X40X1 (TOURNIQUET CUFF) ×1 IMPLANT
DECANTER SPIKE VIAL GLASS SM (MISCELLANEOUS) ×6 IMPLANT
DRAPE ORTHO SPLIT 87X125 STRL (DRAPES) ×6 IMPLANT
DRAPE U-SHAPE 47X51 STRL (DRAPES) ×3 IMPLANT
DRSG ADAPTIC 3X8 NADH LF (GAUZE/BANDAGES/DRESSINGS) ×3 IMPLANT
DRSG PAD ABDOMINAL 8X10 ST (GAUZE/BANDAGES/DRESSINGS) ×6 IMPLANT
DURAPREP 26ML APPLICATOR (WOUND CARE) ×3 IMPLANT
ELECT REM PT RETURN 15FT ADLT (MISCELLANEOUS) ×3 IMPLANT
FEMUR RIGHT SZ 4 (Knees) ×2 IMPLANT
GAUZE SPONGE 4X4 12PLY STRL (GAUZE/BANDAGES/DRESSINGS) ×3 IMPLANT
GLOVE BIOGEL PI IND STRL 8 (GLOVE) ×2 IMPLANT
GLOVE BIOGEL PI INDICATOR 8 (GLOVE) ×4
GLOVE SURG ORTHO 8.0 STRL STRW (GLOVE) ×3 IMPLANT
GLOVE SURG SS PI 7.5 STRL IVOR (GLOVE) ×3 IMPLANT
GOWN STRL REUS W/TWL XL LVL3 (GOWN DISPOSABLE) ×6 IMPLANT
HANDPIECE INTERPULSE COAX TIP (DISPOSABLE) ×3
HOLDER FOLEY CATH W/STRAP (MISCELLANEOUS) IMPLANT
IMMOBILIZER KNEE 20 (SOFTGOODS) ×3
IMMOBILIZER KNEE 20 THIGH 36 (SOFTGOODS) ×1 IMPLANT
IMMOBILIZER KNEE 22 (SOFTGOODS) ×2 IMPLANT
MANIFOLD NEPTUNE II (INSTRUMENTS) ×3 IMPLANT
NDL SAFETY ECLIPSE 18X1.5 (NEEDLE) IMPLANT
NEEDLE HYPO 18GX1.5 SHARP (NEEDLE)
NEEDLE HYPO 22GX1.5 SAFETY (NEEDLE) ×3 IMPLANT
NS IRRIG 1000ML POUR BTL (IV SOLUTION) ×3 IMPLANT
PACK ICE MAXI GEL EZY WRAP (MISCELLANEOUS) ×3 IMPLANT
PACK TOTAL KNEE CUSTOM (KITS) ×3 IMPLANT
PAD ABD 8X10 STRL (GAUZE/BANDAGES/DRESSINGS) ×3 IMPLANT
PADDING CAST COTTON 6X4 STRL (CAST SUPPLIES) ×6 IMPLANT
PATELLA DOME PFC 38MM (Knees) ×3 IMPLANT
PIN STEINMAN FIXATION KNEE (PIN) ×2 IMPLANT
PLATE ROT INSERT 10MM SIZE 4 (Plate) ×2 IMPLANT
POSITIONER SURGICAL ARM (MISCELLANEOUS) ×3 IMPLANT
SET HNDPC FAN SPRY TIP SCT (DISPOSABLE) ×1 IMPLANT
SPONGE LAP 18X18 RF (DISPOSABLE) IMPLANT
STAPLER VISISTAT 35W (STAPLE) IMPLANT
SUT ETHIBOND NAB CT1 #1 30IN (SUTURE) ×6 IMPLANT
SUT VIC AB 0 CT1 36 (SUTURE) ×6 IMPLANT
SUT VIC AB 2-0 CT1 27 (SUTURE) ×6
SUT VIC AB 2-0 CT1 TAPERPNT 27 (SUTURE) ×2 IMPLANT
SYR 30ML LL (SYRINGE) IMPLANT
SYR CONTROL 10ML LL (SYRINGE) ×6 IMPLANT
TIBIA MBT CEMENT SIZE 4 (Knees) ×3 IMPLANT
TRAY FOLEY MTR SLVR 16FR STAT (SET/KITS/TRAYS/PACK) ×3 IMPLANT
WATER STERILE IRR 1000ML POUR (IV SOLUTION) ×6 IMPLANT
WRAP KNEE MAXI GEL POST OP (GAUZE/BANDAGES/DRESSINGS) ×2 IMPLANT
YANKAUER SUCT BULB TIP 10FT TU (MISCELLANEOUS) ×3 IMPLANT

## 2018-09-05 NOTE — Anesthesia Procedure Notes (Signed)
Anesthesia Regional Block: Adductor canal block   Pre-Anesthetic Checklist: ,, timeout performed, Correct Patient, Correct Site, Correct Laterality, Correct Procedure, Correct Position, site marked, Risks and benefits discussed,  Surgical consent,  Pre-op evaluation,  At surgeon's request and post-op pain management  Laterality: Right  Prep: chloraprep       Needles:  Injection technique: Single-shot     Needle Length: 9cm  Needle Gauge: 22     Additional Needles: Arrow StimuQuik ECHO Echogenic Stimulating PNB Needle  Procedures:,,,, ultrasound used (permanent image in chart),,,,  Narrative:  Start time: 09/05/2018 9:41 AM End time: 09/05/2018 9:45 AM Injection made incrementally with aspirations every 5 mL.  Performed by: Personally  Anesthesiologist: Val Eagle, MD

## 2018-09-05 NOTE — Interval H&P Note (Signed)
History and Physical Interval Note:  09/05/2018 9:25 AM  [name]  has presented today for surgery, with the diagnosis of [id number] OA RIGHT KNEE  The various methods of treatment have been discussed with the patient and family. After consideration of risks, benefits and other options for treatment, the patient has consented to  Procedure(s): RIGHT TOTAL KNEE ARTHROPLASTY (Right) as a surgical intervention .  The patient's history has been reviewed, patient examined, no change in status, stable for surgery.  I have reviewed the patient's chart and labs.  Questions were answered to the patient's satisfaction.     Thera Flake

## 2018-09-05 NOTE — Anesthesia Preprocedure Evaluation (Signed)
Anesthesia Evaluation  Patient identified by MRN, date of birth, ID band Patient awake    Reviewed: Allergy & Precautions, NPO status , Patient's Chart, lab work & pertinent test results  History of Anesthesia Complications Negative for: history of anesthetic complications  Airway Mallampati: II  TM Distance: >3 FB Neck ROM: Full    Dental no notable dental hx. (+) Teeth Intact   Pulmonary sleep apnea and Continuous Positive Airway Pressure Ventilation , Current Smoker,    breath sounds clear to auscultation       Cardiovascular hypertension, Pt. on medications + Peripheral Vascular Disease   Rhythm:Regular Rate:Normal     Neuro/Psych negative neurological ROS  negative psych ROS   GI/Hepatic negative GI ROS, Neg liver ROS,   Endo/Other  Hypothyroidism   Renal/GU negative Renal ROS     Musculoskeletal  (+) Arthritis , Osteoarthritis,    Abdominal   Peds  Hematology negative hematology ROS (+)   Anesthesia Other Findings   Reproductive/Obstetrics                             Anesthesia Physical Anesthesia Plan  ASA: II  Anesthesia Plan: MAC, Regional and Spinal   Post-op Pain Management:    Induction:   PONV Risk Score and Plan: 0 and Treatment may vary due to age or medical condition  Airway Management Planned: Nasal Cannula  Additional Equipment: None  Intra-op Plan:   Post-operative Plan:   Informed Consent: I have reviewed the patients History and Physical, chart, labs and discussed the procedure including the risks, benefits and alternatives for the proposed anesthesia with the patient or authorized representative who has indicated his/her understanding and acceptance.   Dental advisory given  Plan Discussed with: CRNA  Anesthesia Plan Comments:         Anesthesia Quick Evaluation

## 2018-09-05 NOTE — Anesthesia Procedure Notes (Signed)
Spinal  Patient location during procedure: OR Start time: 09/05/2018 10:39 AM End time: 09/05/2018 10:44 AM Staffing Anesthesiologist: Val Eagle, MD Performed: anesthesiologist  Preanesthetic Checklist Completed: patient identified, surgical consent, pre-op evaluation, timeout performed, IV checked, risks and benefits discussed and monitors and equipment checked Spinal Block Patient position: sitting Prep: DuraPrep Patient monitoring: heart rate, cardiac monitor, continuous pulse ox and blood pressure Approach: midline Location: L4-5 Injection technique: single-shot Needle Needle type: Pencan  Needle gauge: 24 G Needle length: 9 cm Assessment Sensory level: T6

## 2018-09-05 NOTE — Progress Notes (Signed)
Assisted Dr.Moser with right adductor canal adductor block. Side rails up, monitors on throughout procedure. See vital signs in flow sheet. Tolerated Procedure well.

## 2018-09-05 NOTE — Progress Notes (Signed)
Pt has refused CPAP QHS at this time.  RT to monitor and assess as needed.

## 2018-09-05 NOTE — Op Note (Signed)
NAME: [name], [name]. MEDICAL RECORD ZO:[id number] ACCOUNT [id number] DATE OF BIRTH:[birth year] FACILITY: [facility] LOCATION: [facility] PHYSICIAN:W. Rollo Farquhar JR., MD  OPERATIVE REPORT  DATE OF PROCEDURE:  09/05/2018  PREOPERATIVE DIAGNOSIS:  Severe osteoarthritis, right knee.  POSTOPERATIVE DIAGNOSIS:  Severe osteoarthritis, right knee.  PROCEDURE:  Right total knee replacement (Sigma size 4 femur, tibia, 10 mm bearing with 38 mm all poly patella.  SURGEON:  Freddrick March., MD  ASSISTANT:  Lonia Chimera, PA  TOURNIQUET TIME:  65 minutes.  ANESTHESIA:  Spinal block with local supplementation.  DESCRIPTION OF PROCEDURE:  Spinal anesthetic.  A thigh tourniquet was placed on the thigh.  Tourniquet to 350.  A straight skin incision was made with a medial parapatellar approach to the knee made.  We cut an 11 mm, 5-degree valgus cut on the femur  followed by cutting about 3-4 mm below the most diseased medial compartment with a second provisional cut to obtain full extension.  Extension gap measured at 10 mm.  Femur was sized to be a size 4.   Placed the all-in-1 cutting block and appropriate  degree of external rotation with anterior, posterior chamfer cuts.  Flexion gap equal to extension gap at 10 mm.  PCL was completely released.  Excess portion of the menisci resected as well as posterior osteophytes were removed from the knee.  A keel  cut was made on the tibia, followed by placement of the tibial trial, followed by the box cut for the femur.  Trial femur and tibia were placed with a 10 mm bearing.  Resolution of the mild varus deformity was noted with full extension with good  stability.  The patella was cut, leaving about 16-17 mm of native patella for a 38 mm all poly trial.  All parameters were deemed to be acceptable.  Cement was prepared on the back table and inserted in the doughy state, tibia followed by femur and  patella.  Cement was allowed to harden with the trial bearing  in place.  Once cement was hardened, the trial bearing was removed.  Small bits of cement were removed from the posterior aspect of the knee.  Prior to placing the final bearing, copious  pulsatile irrigation was used as well as infiltration of Exparel and Marcaine.  The tourniquet was released.  No excessive bleeding was noted.  The trial bearing was placed.  Closure was affected with #1 Ethibond, 2-0 Vicryl and Monocryl on the skin.  A  light compressive sterile dressing was applied and taken to recovery room in stable condition.  LN/NUANCE  D:09/05/2018 T:09/05/2018 JOB:[id number]

## 2018-09-05 NOTE — Transfer of Care (Signed)
Immediate Anesthesia Transfer of Care Note  Patient: [name]  Procedure(s) Performed: RIGHT TOTAL KNEE ARTHROPLASTY (Right Knee)  Patient Location: PACU  Anesthesia Type:Spinal  Level of Consciousness: awake, alert  and oriented  Airway & Oxygen Therapy: Patient Spontanous Breathing and Patient connected to face mask oxygen  Post-op Assessment: Report given to RN and Post -op Vital signs reviewed and stable  Post vital signs: Reviewed and stable  Last Vitals:  Vitals Value Taken Time  BP 130/80 09/05/2018  1:05 PM  Temp    Pulse 62 09/05/2018  1:06 PM  Resp 20 09/05/2018  1:06 PM  SpO2 100 % 09/05/2018  1:06 PM  Vitals shown include unvalidated device data.  Last Pain:  Vitals:   09/05/18 0955  TempSrc:   PainSc: 0-No pain         Complications: No apparent anesthesia complications

## 2018-09-05 NOTE — Interval H&P Note (Signed)
History and Physical Interval Note:  09/05/2018 9:25 AM  [name]  has presented today for surgery, with the diagnosis of [id number] RIGHT KNEE  The various methods of treatment have been discussed with the patient and family. After consideration of risks, benefits and other options for treatment, the patient has consented to  Procedure(s): RIGHT TOTAL KNEE ARTHROPLASTY (Right) as a surgical intervention .  The patient's history has been reviewed, patient examined, no change in status, stable for surgery.  I have reviewed the patient's chart and labs.  Questions were answered to the patient's satisfaction.     W D Grant Henkes Jr   

## 2018-09-05 NOTE — Discharge Instructions (Signed)

## 2018-09-05 NOTE — Brief Op Note (Signed)
09/05/2018  12:59 PM  PATIENT:  [name]  62 y.o. male  PRE-OPERATIVE DIAGNOSIS:  [id number] OA RIGHT KNEE  POST-OPERATIVE DIAGNOSIS:  [id number] OA RIGHT KNEE  PROCEDURE:  Procedure(s): RIGHT TOTAL KNEE ARTHROPLASTY (Right)  SURGEON:  Surgeon(s) and Role:    Frederico Hamman, MD - Primary  PHYSICIAN ASSISTANT: Margart Sickles, PA-C  ASSISTANTS: OR staff x1   ANESTHESIA:   local, regional, spinal and IV sedation  EBL:  50 mL   BLOOD ADMINISTERED:none  DRAINS: none   LOCAL MEDICATIONS USED:  MARCAINE     SPECIMEN:  No Specimen  DISPOSITION OF SPECIMEN:  N/A  COUNTS:  YES  TOURNIQUET:   Total Tourniquet Time Documented: Thigh (Right) - 68 minutes Total: Thigh (Right) - 68 minutes   DICTATION: .Other Dictation: Dictation Number unknown  PLAN OF CARE: Admit to inpatient   PATIENT DISPOSITION:  PACU - hemodynamically stable.   Delay start of Pharmacological VTE agent (>24hrs) due to surgical blood loss or risk of bleeding: yes

## 2018-09-06 ENCOUNTER — Encounter (HOSPITAL_COMMUNITY): Payer: Self-pay | Admitting: Orthopedic Surgery

## 2018-09-06 LAB — CBC
HCT: 38.8 % — ABNORMAL LOW (ref 39.0–52.0)
HEMOGLOBIN: 12.8 g/dL — AB (ref 13.0–17.0)
MCH: 32.5 pg (ref 26.0–34.0)
MCHC: 33 g/dL (ref 30.0–36.0)
MCV: 98.5 fL (ref 80.0–100.0)
Platelets: 158 10*3/uL (ref 150–400)
RBC: 3.94 MIL/uL — AB (ref 4.22–5.81)
RDW: 13.2 % (ref 11.5–15.5)
WBC: 14.1 10*3/uL — ABNORMAL HIGH (ref 4.0–10.5)
nRBC: 0 % (ref 0.0–0.2)

## 2018-09-06 LAB — BASIC METABOLIC PANEL
ANION GAP: 10 (ref 5–15)
BUN: 12 mg/dL (ref 8–23)
CHLORIDE: 103 mmol/L (ref 98–111)
CO2: 23 mmol/L (ref 22–32)
Calcium: 8.1 mg/dL — ABNORMAL LOW (ref 8.9–10.3)
Creatinine, Ser: 1.08 mg/dL (ref 0.61–1.24)
GFR calc Af Amer: >60 mL/min (ref >60)
GFR calc non Af Amer: >60 mL/min (ref >60)
GLUCOSE: 194 mg/dL — AB (ref 70–99)
POTASSIUM: 4.1 mmol/L (ref 3.5–5.1)
Sodium: 136 mmol/L (ref 135–145)

## 2018-09-06 NOTE — Progress Notes (Signed)
Physical Therapy Treatment Patient Details Name: [name] MRN: [id number] DOB: 01-08-1956 Today's Date: 09/06/2018    History of Present Illness 62 yo male s/p R TKA 09/05/18. 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     PT Comments    Pt ambulated to hallway to practice stairs.  Pt performs with both rails despite only having one rail at home.  Pt did listen to cues for sequencing.  Pt with slight buckling of R LE during ambulation this afternoon and cued for safer technique.  Also provided gait belt for spouse to use at home.  Pt eager and awaiting d/c home.   Follow Up Recommendations  Follow surgeon's recommendation for DC plan and follow-up therapies     Equipment Recommendations  None recommended by PT    Recommendations for Other Services       Precautions / Restrictions Precautions Precautions: Fall Restrictions Weight Bearing Restrictions: No Other Position/Activity Restrictions: WBAT    Mobility  Bed Mobility               General bed mobility comments: oob in recliner  Transfers Overall transfer level: Needs assistance Equipment used: Rolling walker (2 wheeled) Transfers: Sit to/from Stand Sit to Stand: Min guard         General transfer comment: min/guard for safety, verbal cues for UE and LE positioning  Ambulation/Gait Ambulation/Gait assistance: Min guard Gait Distance (Feet): 60 Feet Assistive device: Rolling walker (2 wheeled) Gait Pattern/deviations: Step-through pattern;Decreased stance time - right     General Gait Details: verbal cues for small steps and use of RW as pt with occasional buckling, keeps RW too far forward, improved upon returning to room   Stairs Stairs: Yes Stairs assistance: Min guard Stair Management: Step to pattern;Forwards;Two rails Number of Stairs: 2 General stair comments: pt reports one rail at home however utilized 2 rails, despite cues for use of one, verbal cues for sequence and  safety   Wheelchair Mobility    Modified Rankin (Stroke Patients Only)       Balance                                            Cognition Arousal/Alertness: Awake/alert Behavior During Therapy: Flat affect Overall Cognitive Status: Within Functional Limits for tasks assessed                                 General Comments: pt states he just wishes to go home however agreeable to perform steps prior to d/c      Exercises      General Comments        Pertinent Vitals/Pain Pain Assessment: Faces Pain Score: 8  Faces Pain Scale: Hurts little more Pain Location: R knee with activity Pain Descriptors / Indicators: Aching;Discomfort;Grimacing Pain Intervention(s): Limited activity within patient's tolerance;Monitored during session    Home Living Family/patient expects to be discharged to:: Private residence Living Arrangements: Spouse/significant other Available Help at Discharge: Family Type of Home: House Home Access: Stairs to enter Entrance Stairs-Rails: Right Home Layout: One level Home Equipment: Environmental consultant - 2 wheels      Prior Function Level of Independence: Independent          PT Goals (current goals can now be found in the care plan section) Acute  Rehab PT Goals Patient Stated Goal: home! Progress towards PT goals: Progressing toward goals    Frequency    7X/week      PT Plan Current plan remains appropriate    Co-evaluation              AM-PAC PT "6 Clicks" Daily Activity  Outcome Measure  Difficulty turning over in bed (including adjusting bedclothes, sheets and blankets)?: A Lot Difficulty moving from lying on back to sitting on the side of the bed? : A Lot Difficulty sitting down on and standing up from a chair with arms (e.g., wheelchair, bedside commode, etc,.)?: A Little Help needed moving to and from a bed to chair (including a wheelchair)?: A Little Help needed walking in hospital room?: A  Little Help needed climbing 3-5 steps with a railing? : A Little 6 Click Score: 16    End of Session Equipment Utilized During Treatment: Gait belt Activity Tolerance: Patient tolerated treatment well Patient left: in chair;with call bell/phone within reach;with family/visitor present;with chair alarm set   PT Visit Diagnosis: Pain;History of falling (Z91.81);Other abnormalities of gait and mobility (R26.89) Pain - Right/Left: Right Pain - part of body: Knee     Time: 1610-9604 PT Time Calculation (min) (ACUTE ONLY): 9 min  Charges:  $Gait Training: 8-22 mins                    Zenovia Jarred, PT, DPT Acute Rehabilitation Services Office: [phone] Pager: [phone]  Sarajane Jews 09/06/2018, 2:25 PM

## 2018-09-06 NOTE — Care Management (Signed)
Message to UR for Code 44, approved IP. Isidoro Donning RN CCM Case Mgmt phone [phone]

## 2018-09-06 NOTE — Evaluation (Signed)
Occupational Therapy Evaluation Patient Details Name: [name] MRN: [id number] DOB: 09-Sep-1956 Today's Date: 09/06/2018    History of Present Illness 62 yo male s/p R TKA 09/05/18. 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    Clinical Impression   Pt is s/p TKA resulting in the deficits listed below (see OT Problem List).  Pt will benefit from skilled OT to increase their safety and independence with ADL and functional mobility for ADL to facilitate discharge to venue listed below.        Follow Up Recommendations  No OT follow up    Equipment Recommendations  None recommended by OT    Recommendations for Other Services       Precautions / Restrictions Precautions Precautions: Fall Restrictions Weight Bearing Restrictions: No Other Position/Activity Restrictions: WBAT      Mobility Bed Mobility               General bed mobility comments: oob in recliner  Transfers Overall transfer level: Needs assistance Equipment used: Rolling walker (2 wheeled) Transfers: Sit to/from Stand Sit to Stand: Min assist         General transfer comment: Increased time. VCs safety.     Balance Overall balance assessment: Needs assistance;History of Falls         Standing balance support: Bilateral upper extremity supported Standing balance-Leahy Scale: Poor                             ADL either performed or assessed with clinical judgement   ADL Overall ADL's : Needs assistance/impaired Eating/Feeding: Set up;Sitting   Grooming: Set up;Sitting   Upper Body Bathing: Set up;Sitting   Lower Body Bathing: Sit to/from stand;Moderate assistance;Cueing for sequencing;Cueing for safety   Upper Body Dressing : Set up;Sitting   Lower Body Dressing: Moderate assistance;Sit to/from stand;Cueing for sequencing;Cueing for safety   Toilet Transfer: Minimal assistance;RW;Comfort height toilet   Toileting- Clothing Manipulation and Hygiene: Moderate  assistance;Sit to/from stand         General ADL Comments: pt needed VC for safety     Vision Patient Visual Report: No change from baseline       Perception     Praxis      Pertinent Vitals/Pain Pain Assessment: Faces Pain Score: 8  Faces Pain Scale: Hurts whole lot Pain Location: R knee with activity Pain Descriptors / Indicators: Aching;Discomfort;Grimacing Pain Intervention(s): Limited activity within patient's tolerance;Repositioned;Monitored during session;Patient requesting pain meds-RN notified     Hand Dominance     Extremity/Trunk Assessment Upper Extremity Assessment Upper Extremity Assessment: Overall WFL for tasks assessed   Lower Extremity Assessment Lower Extremity Assessment: Generalized weakness(s/p R TKA)   Cervical / Trunk Assessment Cervical / Trunk Assessment: Normal   Communication Communication Communication: No difficulties   Cognition Arousal/Alertness: Awake/alert(but drowsy) Behavior During Therapy: Agitated(mildly. ) Overall Cognitive Status: Within Functional Limits for tasks assessed                                 General Comments: pt is a bit irritable. Requires encouragement and a bit of "back and forth" to get him to cooperate.   General Comments               Home Living Family/patient expects to be discharged to:: Private residence Living Arrangements: Spouse/significant other Available Help at Discharge: Family Type of  Home: House Home Access: Stairs to enter [employer] of Steps: 2 Entrance Stairs-Rails: Right Home Layout: One level               Home Equipment: Walker - 2 wheels          Prior Functioning/Environment Level of Independence: Independent                 OT Problem List: Decreased strength;Decreased activity tolerance;Impaired balance (sitting and/or standing);Decreased safety awareness;Pain      OT Treatment/Interventions: Self-care/ADL  training;Patient/family education;DME and/or AE instruction    OT Goals(Current goals can be found in the care plan section) Acute Rehab OT Goals Patient Stated Goal: home! OT Goal Formulation: With patient Time For Goal Achievement: 09/06/18  OT Frequency: Min 2X/week   Barriers to [name]/C:               AM-PAC PT "6 Clicks" Daily Activity     Outcome Measure Help from another person eating meals?: None Help from another person taking care of personal grooming?: None Help from another person toileting, which includes using toliet, bedpan, or urinal?: A Little Help from another person bathing (including washing, rinsing, drying)?: A Lot Help from another person to put on and taking off regular upper body clothing?: A Little Help from another person to put on and taking off regular lower body clothing?: A Lot 6 Click Score: 18   End of Session Nurse Communication: Mobility status  Activity Tolerance: Treatment limited secondary to agitation Patient left: in chair;with call bell/phone within reach  OT Visit Diagnosis: Unsteadiness on feet (R26.81);Other abnormalities of gait and mobility (R26.89);History of falling (Z91.81)                Time: 4098-1191 OT Time Calculation (min): 28 min Charges:  OT General Charges $OT Visit: 1 Visit OT Evaluation $OT Eval Moderate Complexity: 1 Mod OT Treatments $Self Care/Home Management : 8-22 mins  Lise Auer, OT Acute Rehabilitation Services [phone] Office- [phone]     [name], [name] 09/06/2018, 12:14 PM

## 2018-09-06 NOTE — Evaluation (Signed)
Physical Therapy Evaluation Patient Details Name: [name] MRN: [id number] DOB: 1956-09-24 Today's Date: 09/06/2018   History of Present Illness  62 yo male s/p R TKA 09/05/18. 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   Clinical Impression  On eval, pt was Min guard assist for mobility. He walked ~60 feet with a RW. Pt c/o significant pain but he was able to participate. Mod encouragement to get pt to agree to work with PT. He is mildly agitated and drowsy. Some concern for pt safety. Wife is present but she reports he is "stubborn" and noncompliant with her. Feel pt would benefit from a 2nd session prior to d/c, if he will agree to [facility] RN aware.     Follow Up Recommendations Follow surgeon's recommendation for DC plan and follow-up therapies    Equipment Recommendations  None recommended by PT    Recommendations for Other Services       Precautions / Restrictions Precautions Precautions: Fall Restrictions Weight Bearing Restrictions: No Other Position/Activity Restrictions: WBAT      Mobility  Bed Mobility               General bed mobility comments: oob in recliner  Transfers Overall transfer level: Needs assistance Equipment used: Rolling walker (2 wheeled) Transfers: Sit to/from Stand Sit to Stand: Min guard         General transfer comment: Increased time. VCs safety.   Ambulation/Gait Ambulation/Gait assistance: Min guard Gait Distance (Feet): 60 Feet Assistive device: Rolling walker (2 wheeled) Gait Pattern/deviations: Step-to pattern;Trunk flexed     General Gait Details: Mod cues for safety, posture, distance from walker, step length. Very close guarding provided.   Stairs            Wheelchair Mobility    Modified Rankin (Stroke Patients Only)       Balance Overall balance assessment: Needs assistance;History of Falls         Standing balance support: Bilateral upper extremity supported Standing balance-Leahy  Scale: Poor                               Pertinent Vitals/Pain Pain Assessment: Faces Faces Pain Scale: Hurts whole lot Pain Location: R knee with activity Pain Descriptors / Indicators: Aching;Discomfort;Grimacing Pain Intervention(s): Monitored during session;Repositioned;Ice applied;Patient requesting pain meds-RN notified    Home Living Family/patient expects to be discharged to:: Private residence Living Arrangements: Spouse/significant other Available Help at Discharge: Family Type of Home: House Home Access: Stairs to enter Entrance Stairs-Rails: Right Entrance Stairs-Number of Steps: 2 Home Layout: One level Home Equipment: Environmental consultant - 2 wheels      Prior Function Level of Independence: Independent               Hand Dominance        Extremity/Trunk Assessment   Upper Extremity Assessment Upper Extremity Assessment: Overall WFL for tasks assessed    Lower Extremity Assessment Lower Extremity Assessment: Generalized weakness(s/p R TKA)    Cervical / Trunk Assessment Cervical / Trunk Assessment: Normal  Communication   Communication: No difficulties  Cognition Arousal/Alertness: Awake/alert(but drowsy) Behavior During Therapy: Agitated(mildly. ) Overall Cognitive Status: Within Functional Limits for tasks assessed                                 General Comments: pt is a bit irritable. Requires encouragement  and a bit of "back and forth" to get him to cooperate.      General Comments      Exercises Total Joint Exercises Ankle Circles/Pumps: AROM;Both;10 reps;Supine Quad Sets: AROM;Both;5 reps;Supine Heel Slides: AAROM;Right;5 reps;Supine Hip ABduction/ADduction: AAROM;Right;5 reps;Supine Straight Leg Raises: AAROM;Right;5 reps;Supine Goniometric ROM: ~10-60 degrees   Assessment/Plan    PT Assessment Patient needs continued PT services  PT Problem List Decreased strength;Decreased balance;Decreased range of  motion;Decreased mobility;Decreased activity tolerance;Pain;Decreased knowledge of use of DME       PT Treatment Interventions DME instruction;Gait training;Functional mobility training;Therapeutic activities;Balance training;Patient/family education;Therapeutic exercise;Stair training    PT Goals (Current goals can be found in the Care Plan section)  Acute Rehab PT Goals Patient Stated Goal: home! PT Goal Formulation: With patient/family Time For Goal Achievement: 09/20/18 Potential to Achieve Goals: Good    Frequency 7X/week   Barriers to discharge        Co-evaluation               AM-PAC PT "6 Clicks" Daily Activity  Outcome Measure Difficulty turning over in bed (including adjusting bedclothes, sheets and blankets)?: A Lot Difficulty moving from lying on back to sitting on the side of the bed? : A Lot Difficulty sitting down on and standing up from a chair with arms (e.g., wheelchair, bedside commode, etc,.)?: A Little Help needed moving to and from a bed to chair (including a wheelchair)?: A Little Help needed walking in hospital room?: A Little Help needed climbing 3-5 steps with a railing? : A Lot 6 Click Score: 15    End of Session Equipment Utilized During Treatment: Gait belt Activity Tolerance: Patient limited by pain(limited by drowsiness) Patient left: in chair;with call bell/phone within reach;with family/visitor present   PT Visit Diagnosis: Pain;History of falling (Z91.81);Other abnormalities of gait and mobility (R26.89) Pain - Right/Left: Right Pain - part of body: Knee    Time: 1610-9604 PT Time Calculation (min) (ACUTE ONLY): 31 min   Charges:   PT Evaluation $PT Eval Low Complexity: 1 Low PT Treatments $Gait Training: 8-22 mins          Rebeca Alert, PT Acute Rehabilitation Services Pager: [phone] Office: [phone]

## 2018-09-06 NOTE — Care Management Note (Signed)
Case Management Note  Patient Details  Name: [name] MRN: [id number] Date of Birth: [birth year]  Subjective/Objective:    Right TKA                Action/Plan: Pt scheduled for OPPT and has DME at home from previous surgeries.   Expected Discharge Date:  09/06/18               Expected Discharge Plan:  OP Rehab  In-House Referral:  NA  Discharge planning Services  CM Consult  Post Acute Care Choice:  NA Choice offered to:  NA  DME Arranged:  N/A DME Agency:  NA  HH Arranged:  NA HH Agency:  NA  Status of Service:  Completed, signed off  If discussed at Long Length of Stay Meetings, dates discussed:    Additional Comments:  Elliot Cousin, RN 09/06/2018, 10:20 AM

## 2018-09-06 NOTE — Progress Notes (Signed)
Subjective: 1 Day Post-Op Procedure(s) (LRB): RIGHT TOTAL KNEE ARTHROPLASTY (Right) Patient reports pain as mild.    Objective: Vital signs in last 24 hours: Temp:  [97.4 F (36.3 C)-100.5 F (38.1 C)] 98.6 F (37 C) (10/12 0517) Pulse Rate:  [58-68] 58 (10/12 0517) Resp:  [13-25] 14 (10/12 0517) BP: (103-149)/(63-96) 103/66 (10/12 0517) SpO2:  [95 %-100 %] 95 % (10/12 0517) Weight:  [96.8 kg] 96.8 kg (10/11 0831)  Intake/Output from previous day: 10/11 0701 - 10/12 0700 In: 4000.4 [P.O.:600; I.V.:3200.4; IV Piggyback:200] Out: 4098 [JXBJY:7829; Blood:50] Intake/Output this shift: No intake/output data recorded.  Recent Labs    09/06/18 0352  HGB 12.8*   Recent Labs    09/06/18 0352  WBC 14.1*  RBC 3.94*  HCT 38.8*  PLT 158   Recent Labs    09/06/18 0352  NA 136  K 4.1  CL 103  CO2 23  BUN 12  CREATININE 1.08  GLUCOSE 194*  CALCIUM 8.1*   No results for input(s): LABPT, INR in the last 72 hours.  Neurovascular intact Sensation intact distally Intact pulses distally Dorsiflexion/Plantar flexion intact Incision: dressing C/D/I  Anticipated LOS equal to or greater than 2 midnights due to - Age 63 and older with one or more of the following:  - Obesity  - Expected need for hospital services (PT, OT, Nursing) required for safe  discharge  - Anticipated need for postoperative skilled nursing care or inpatient rehab  - Active co-morbidities: None OR   - Unanticipated findings during/Post Surgery: None  - Patient is a high risk of re-admission due to: None   Assessment/Plan: 1 Day Post-Op Procedure(s) (LRB): RIGHT TOTAL KNEE ARTHROPLASTY (Right) Up with therapy  Discharge home after PT this AM Pt is scheduled for outpt PT ASA DVT proph Oxycodone/Tylenol pain control WBAT RLE    Margart Sickles 09/06/2018, 8:21 AM

## 2018-09-06 NOTE — Discharge Summary (Signed)
PATIENT ID: [name]        MRN:  [id number]          DOB/AGE: 63-24-1957 / 63 y.o.    DISCHARGE SUMMARY  ADMISSION DATE:    09/05/2018 DISCHARGE DATE:   09/06/2018   ADMISSION DIAGNOSIS: [id number] OA RIGHT KNEE    DISCHARGE DIAGNOSIS:  [id number] OA RIGHT KNEE    ADDITIONAL DIAGNOSIS: Active Problems:   S/P knee replacement  Past Medical History:  Diagnosis Date  . Arthritis   . Family history of adverse reaction to anesthesia    "mother allergic to one of them, don't know what it was"  . Hypertension   . Hypogonadism in male   . Hypothyroidism   . OSA on CPAP   . PAD (peripheral artery disease) (HCC)    followed by dr berry cheek  . Thrombocytopenia (HCC)    followed by dr Cathie Hoops (hemotolgist w/ [facility])-- mild intermittant    PROCEDURE: Procedure(s): RIGHT TOTAL KNEE ARTHROPLASTY Right on 09/05/2018  CONSULTS: PT/OT    HISTORY:  See H&P in chart  HOSPITAL COURSE:  [name] is a 62 y.o. admitted on 09/05/2018 and found to have a diagnosis of [id number] OA RIGHT KNEE.  After appropriate laboratory studies were obtained  they were taken to the operating room on 09/05/2018 and underwent  Procedure(s): RIGHT TOTAL KNEE ARTHROPLASTY  Right.   They were given perioperative antibiotics:  Anti-infectives (From admission, onward)   Start     Dose/Rate Route Frequency Ordered Stop   09/05/18 1700  ceFAZolin (ANCEF) IVPB 1 g/50 mL premix     1 g 100 mL/hr over 30 Minutes Intravenous Every 6 hours 09/05/18 1533 09/05/18 2340   09/05/18 0815  ceFAZolin (ANCEF) IVPB 2g/100 mL premix     2 g 200 mL/hr over 30 Minutes Intravenous On call to O.R. 09/05/18 7425 09/05/18 1126    .  Tolerated the procedure well.  Placed with a foley intraoperatively.      POD #1, allowed out of bed to a chair.  PT for ambulation and exercise program.  Foley D/C'd in morning.  IV saline locked.  O2 discontionued.  The remainder of the hospital course was dedicated to ambulation and  strengthening.   The patient was discharged on 1 Day Post-Op in  Stable condition.  Blood products given:none  DIAGNOSTIC STUDIES: Recent vital signs:  Patient Vitals for the past 24 hrs:  BP Temp Temp src Pulse Resp SpO2 Height Weight  09/06/18 0517 103/66 98.6 F (37 C) Axillary (!) 58 14 95 % - -  09/05/18 2056 - - - 68 18 98 % - -  09/05/18 1845 135/81 (!) 100.5 F (38.1 C) Oral 67 14 98 % - -  09/05/18 1812 137/82 98 F (36.7 C) Oral 66 14 97 % - -  09/05/18 1636 128/63 99.2 F (37.3 C) Oral 66 14 99 % - -  09/05/18 1529 (!) 143/86 99.6 F (37.6 C) Oral 62 14 100 % - -  09/05/18 1515 132/85 - - 63 17 99 % - -  09/05/18 1513 132/85 (!) 97.4 F (36.3 C) - 62 20 98 % - -  09/05/18 1500 131/76 - - 62 (!) 21 98 % - -  09/05/18 1445 123/76 - - 63 (!) 21 99 % - -  09/05/18 1430 117/74 - - 62 15 98 % - -  09/05/18 1415 110/81 - - 65 17 97 % - -  09/05/18 1400 110/77 - - 64  17 97 % - -  09/05/18 1345 119/78 - - 61 19 98 % - -  09/05/18 1330 (!) 111/96 - - 60 19 100 % - -  09/05/18 1315 121/79 - - 61 19 100 % - -  09/05/18 1305 130/80 98.1 F (36.7 C) - 62 (!) 21 100 % - -  09/05/18 1025 133/77 - - 64 16 99 % - -  09/05/18 1020 136/84 - - 65 20 99 % - -  09/05/18 1015 139/88 - - 63 (!) 22 100 % - -  09/05/18 1010 135/85 - - 63 (!) 25 97 % - -  09/05/18 1005 136/71 - - 64 18 100 % - -  09/05/18 1000 (!) 142/83 - - 63 17 100 % - -  09/05/18 0955 (!) 143/89 - - 61 19 100 % - -  09/05/18 0954 - - - 62 13 100 % - -  09/05/18 0950 (!) 148/83 - - 63 14 100 % - -  09/05/18 0948 - - - 63 - 100 % - -  09/05/18 0945 (!) 149/81 - - 62 - 100 % - -  09/05/18 0831 - - - - - - 6\' 2"  (1.88 m) 96.8 kg       Recent laboratory studies: Recent Labs    09/06/18 0352  WBC 14.1*  HGB 12.8*  HCT 38.8*  PLT 158   Recent Labs    09/06/18 0352  NA 136  K 4.1  CL 103  CO2 23  BUN 12  CREATININE 1.08  GLUCOSE 194*  CALCIUM 8.1*   Lab Results  Component Value Date   INR 0.98  05/26/2018   INR 0.9 08/01/2007   INR 0.9 07/25/2007     Recent Radiographic Studies :  No results found.  DISCHARGE INSTRUCTIONS:   DISCHARGE MEDICATIONS:   Allergies as of 09/06/2018   No Known Allergies     Medication List    STOP taking these medications   diclofenac 75 MG EC tablet Commonly known as:  VOLTAREN   testosterone cypionate 200 MG/ML injection Commonly known as:  DEPOTESTOSTERONE CYPIONATE     TAKE these medications   acetaminophen 500 MG tablet Commonly known as:  TYLENOL Take 1,000 mg by mouth every 6 (six) hours as needed for moderate pain or headache.   amLODipine 5 MG tablet Commonly known as:  NORVASC Take 5 mg by mouth 2 (two) times daily.   aspirin EC 81 MG tablet Take 1 tablet (81 mg total) by mouth 2 (two) times daily.   diazepam 5 MG tablet Commonly known as:  VALIUM Take 5 mg by mouth 4 (four) times daily as needed for anxiety.   gabapentin 300 MG capsule Commonly known as:  NEURONTIN Take 300 mg by mouth at bedtime as needed (for pain).   levocetirizine 5 MG tablet Commonly known as:  XYZAL Take 5 mg by mouth daily as needed for allergies.   levothyroxine 50 MCG tablet Commonly known as:  SYNTHROID, LEVOTHROID Take 50 mcg by mouth daily before breakfast.   multivitamin tablet Take 1 tablet by mouth 2 (two) times daily.   oxyCODONE 5 MG immediate release tablet Commonly known as:  Oxy IR/ROXICODONE One tab po q4-6hrs prn pain, may need 1-2 tabs first few weeks What changed:  additional instructions   propranolol ER 60 MG 24 hr capsule Commonly known as:  INDERAL LA Take 60 mg by mouth 2 (two) times daily.   rosuvastatin 20 MG tablet Commonly  known as:  CRESTOR Take 20 mg by mouth 2 (two) times daily.   tamsulosin 0.4 MG Caps capsule Commonly known as:  FLOMAX Take 0.4 mg by mouth at bedtime.   venlafaxine XR 150 MG 24 hr capsule Commonly known as:  EFFEXOR-XR Take 150 mg by mouth daily with breakfast.   Vitamin  D3 5000 units Tabs Take 5,000 Units by mouth daily.   vitamin E 1000 UNIT capsule Take 1,000 Units by mouth daily.   zolpidem 10 MG tablet Commonly known as:  AMBIEN Take 10 mg by mouth at bedtime.       FOLLOW UP VISIT:   Follow-up Information    Frederico Hamman, MD. Schedule an appointment as soon as possible for a visit in 2 weeks.   Specialty:  Orthopedic Surgery Contact information: [address] [place] [address] [phone]           DISPOSITION:   Home  CONDITION:  Stable   Margart Sickles, PA-C  09/06/2018 8:19 AM

## 2018-09-06 NOTE — Plan of Care (Signed)
  Problem: Health Behavior/Discharge Planning: Goal: Ability to manage health-related needs will improve Outcome: Progressing   Problem: Clinical Measurements: Goal: Will remain free from infection Outcome: Progressing Goal: Diagnostic test results will improve Outcome: Progressing Goal: Respiratory complications will improve Outcome: Progressing Goal: Cardiovascular complication will be avoided Outcome: Progressing   Problem: Elimination: Goal: Will not experience complications related to urinary retention Outcome: Progressing   Problem: Pain Managment: Goal: General experience of comfort will improve Outcome: Progressing   Problem: Safety: Goal: Ability to remain free from injury will improve Outcome: Progressing   Problem: Education: Goal: Knowledge of the prescribed therapeutic regimen will improve Outcome: Progressing   Reeves Forth, RN 09/06/18 12:05 AM

## 2018-09-15 ENCOUNTER — Encounter (HOSPITAL_COMMUNITY): Payer: Self-pay | Admitting: Orthopedic Surgery

## 2018-09-15 NOTE — Anesthesia Postprocedure Evaluation (Signed)
Anesthesia Post Note  Patient: [name]  Procedure(s) Performed: RIGHT TOTAL KNEE ARTHROPLASTY (Right Knee)     Patient location during evaluation: PACU Anesthesia Type: Regional, MAC and Spinal Level of consciousness: awake and alert Pain management: pain level controlled Vital Signs Assessment: post-procedure vital signs reviewed and stable Respiratory status: spontaneous breathing, nonlabored ventilation, respiratory function stable and patient connected to nasal cannula oxygen Cardiovascular status: stable and blood pressure returned to baseline Postop Assessment: no apparent nausea or vomiting and spinal receding Anesthetic complications: no    Last Vitals:  Vitals:   09/06/18 0942 09/06/18 1328  BP: 112/73 129/71  Pulse: 60 63  Resp: 16 18  Temp: 37.2 C 37.2 C  SpO2: 95% 100%    Last Pain:  Vitals:   09/06/18 1328  TempSrc: Oral  PainSc:                  Jazleen Robeck

## 2023-01-18 ENCOUNTER — Other Ambulatory Visit: Payer: Self-pay | Admitting: Orthopaedic Surgery

## 2023-01-18 DIAGNOSIS — Z01818 Encounter for other preprocedural examination: Secondary | ICD-10-CM

## 2023-01-23 ENCOUNTER — Ambulatory Visit
Admission: RE | Admit: 2023-01-23 | Discharge: 2023-01-23 | Disposition: A | Discharge status: Home / Self Care | Payer: Medicare Other | Source: Ambulatory Visit | Attending: Orthopaedic Surgery | Admitting: Orthopaedic Surgery

## 2023-01-23 ENCOUNTER — Other Ambulatory Visit: Payer: Self-pay

## 2023-01-23 DIAGNOSIS — Z01818 Encounter for other preprocedural examination: Secondary | ICD-10-CM

## 2023-02-12 NOTE — Addendum Note (Signed)
Encounter addended by: Caryl Bis, Izora Ribas, RT on: 02/12/2023 10:15 AM  Actions taken: Imaging Exam ended

## 2023-02-25 ENCOUNTER — Other Ambulatory Visit: Payer: Self-pay | Admitting: Orthopaedic Surgery

## 2023-02-25 DIAGNOSIS — Z01818 Encounter for other preprocedural examination: Secondary | ICD-10-CM

## 2023-03-22 ENCOUNTER — Ambulatory Visit
Admission: RE | Admit: 2023-03-22 | Discharge: 2023-03-22 | Disposition: A | Discharge status: Home / Self Care | Payer: Medicare Other | Source: Ambulatory Visit | Attending: Orthopaedic Surgery | Admitting: Orthopaedic Surgery

## 2023-03-22 DIAGNOSIS — Z01818 Encounter for other preprocedural examination: Secondary | ICD-10-CM
